# Patient Record
Sex: Female | Born: 2000 | Race: White | Hispanic: No | Marital: Single | Smoking: Former smoker
Health system: Southern US, Community
[De-identification: ages and names within clinical notes are randomized; demographics above are authoritative.]

## PROBLEM LIST (undated history)

## (undated) DIAGNOSIS — Z5189 Encounter for other specified aftercare: Secondary | ICD-10-CM

## (undated) DIAGNOSIS — K5900 Constipation, unspecified: Secondary | ICD-10-CM

## (undated) DIAGNOSIS — R109 Unspecified abdominal pain: Secondary | ICD-10-CM

## (undated) DIAGNOSIS — F909 Attention-deficit hyperactivity disorder, unspecified type: Secondary | ICD-10-CM

## (undated) DIAGNOSIS — M41124 Adolescent idiopathic scoliosis, thoracic region: Secondary | ICD-10-CM

## (undated) HISTORY — DX: Unspecified abdominal pain: R10.9

## (undated) HISTORY — PX: WISDOM TOOTH EXTRACTION: SHX21

## (undated) HISTORY — DX: Adolescent idiopathic scoliosis, thoracic region: M41.124

## (undated) HISTORY — DX: Encounter for other specified aftercare: Z51.89

## (undated) HISTORY — DX: Constipation, unspecified: K59.00

## (undated) HISTORY — DX: Attention-deficit hyperactivity disorder, unspecified type: F90.9

## (undated) HISTORY — PX: DILATION AND CURETTAGE OF UTERUS: SHX78

---
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
## 2021-01-04 ENCOUNTER — Telehealth: Payer: Self-pay | Admitting: Adult Health

## 2021-01-04 NOTE — Telephone Encounter (Signed)
Left message to call me.

## 2021-01-26 ENCOUNTER — Other Ambulatory Visit: Payer: Self-pay

## 2021-01-26 ENCOUNTER — Other Ambulatory Visit (INDEPENDENT_AMBULATORY_CARE_PROVIDER_SITE_OTHER): Payer: BLUE CROSS/BLUE SHIELD

## 2021-01-26 ENCOUNTER — Other Ambulatory Visit (HOSPITAL_COMMUNITY)
Admission: RE | Admit: 2021-01-26 | Discharge: 2021-01-26 | Disposition: A | Discharge status: Home / Self Care | Payer: BLUE CROSS/BLUE SHIELD | Source: Ambulatory Visit | Attending: Obstetrics & Gynecology | Admitting: Obstetrics & Gynecology

## 2021-01-26 DIAGNOSIS — Z113 Encounter for screening for infections with a predominantly sexual mode of transmission: Secondary | ICD-10-CM

## 2021-01-26 DIAGNOSIS — A749 Chlamydial infection, unspecified: Secondary | ICD-10-CM

## 2021-01-26 DIAGNOSIS — Z1389 Encounter for screening for other disorder: Secondary | ICD-10-CM | POA: Diagnosis not present

## 2021-01-26 LAB — POCT URINALYSIS DIPSTICK OB
Blood, UA: NEGATIVE
Glucose, UA: NEGATIVE
Ketones, UA: NEGATIVE
Leukocytes, UA: NEGATIVE
Nitrite, UA: NEGATIVE
POC,PROTEIN,UA: NEGATIVE

## 2021-01-26 NOTE — Progress Notes (Addendum)
   NURSE VISIT- VAGINITIS/STD/POC  SUBJECTIVE:  Carol Buck is a 20 y.o. G0P0000 GYN patientfemale here for a vaginal swab for proof of cure after treatment for Chlamydia.  She reports the following symptoms: discharge described as white for 4 weeks. Denies abnormal vaginal bleeding, significant pelvic pain, fever, or UTI symptoms.  OBJECTIVE:  There were no vitals taken for this visit.  Appears well, in no apparent distress  ASSESSMENT: Vaginal swab for proof of cure after treatment for chlamydia  PLAN: Self-collected vaginal probe for Chlamydia sent to lab Treatment: to be determined once results are received Follow-up as needed if symptoms persist/worsen, or new symptoms develop    NURSE VISIT- UTI SYMPTOMS   SUBJECTIVE:  Carol Buck is a 20 y.o. G0P0000 female here for UTI symptoms. She is a GYN patient. She reports N/A.  OBJECTIVE:  There were no vitals taken for this visit.  Appears well, in no apparent distress  Results for orders placed or performed in visit on 01/26/21 (from the past 24 hour(s))  POC Urinalysis Dipstick OB   Collection Time: 01/26/21 11:14 AM  Result Value Ref Range   Color, UA     Clarity, UA     Glucose, UA Negative Negative   Bilirubin, UA     Ketones, UA neg    Spec Grav, UA     Blood, UA neg    pH, UA     POC,PROTEIN,UA Negative Negative, Trace, Small (1+), Moderate (2+), Large (3+), 4+   Urobilinogen, UA     Nitrite, UA neg    Leukocytes, UA Negative Negative   Appearance     Odor      ASSESSMENT: GYN patient with UTI symptoms and negative nitrites  PLAN: Note routed to Cathie Beams, CNM   Rx sent by provider today: No Urine culture not sent Call or return to clinic prn if these symptoms worsen or fail to improve as anticipated. Follow-up: as needed   Evynn Boutelle A Florita Nitsch  01/26/2021 11:15 AM   Chart reviewed for nurse visit. Agree with plan of care.  Jacklyn Shell, [place] 01/26/2021 1:49 PM

## 2021-01-27 LAB — HIV ANTIBODY (ROUTINE TESTING W REFLEX): HIV Screen 4th Generation wRfx: NONREACTIVE

## 2021-01-27 LAB — HSV 2 ANTIBODY, IGG: HSV 2 IgG, Type Spec: <0.91 index (ref 0.00–0.90)

## 2021-01-27 LAB — CERVICOVAGINAL ANCILLARY ONLY
Chlamydia: NEGATIVE
Comment: NEGATIVE
Comment: NORMAL
Neisseria Gonorrhea: NEGATIVE

## 2021-01-27 LAB — RPR: RPR Ser Ql: NONREACTIVE

## 2021-01-28 LAB — URINE CULTURE

## 2023-06-04 LAB — OB RESULTS CONSOLE HIV ANTIBODY (ROUTINE TESTING): HIV: NONREACTIVE

## 2023-06-04 LAB — OB RESULTS CONSOLE GC/CHLAMYDIA
Chlamydia: NEGATIVE
Neisseria Gonorrhea: NEGATIVE

## 2023-06-04 LAB — OB RESULTS CONSOLE HEPATITIS B SURFACE ANTIGEN: Hepatitis B Surface Ag: NEGATIVE

## 2023-06-04 LAB — OB RESULTS CONSOLE ABO/RH: RH Type: POSITIVE

## 2023-06-04 LAB — OB RESULTS CONSOLE ANTIBODY SCREEN: Antibody Screen: NEGATIVE

## 2023-06-04 LAB — HEPATITIS C ANTIBODY: HCV Ab: NEGATIVE

## 2023-06-04 LAB — OB RESULTS CONSOLE RPR: RPR: NONREACTIVE

## 2023-06-04 LAB — OB RESULTS CONSOLE RUBELLA ANTIBODY, IGM: Rubella: IMMUNE

## 2023-07-11 LAB — PANORAMA PRENATAL TEST FULL PANEL:PANORAMA TEST PLUS 5 ADDITIONAL MICRODELETIONS: FETAL FRACTION: 10.3

## 2023-12-02 ENCOUNTER — Telehealth: Payer: Self-pay

## 2023-12-02 NOTE — Telephone Encounter (Signed)
Tish can you look over this patients records she would like to transfer here from [facility] her records are in care everywhere she is 36 weeks today.

## 2023-12-03 NOTE — Telephone Encounter (Signed)
Returned patient's call. States she is [redacted] weeks pregnant and is wanting to transfer care to our office.  She is scheduled for an ultrasound next week to check on the presentation of the baby as it was breech at her last ultrasound.  Informed patient, she would not need an additional ultrasound here based on that but we could get her in next week.  Pt verbalized understanding and appt made.

## 2023-12-11 ENCOUNTER — Encounter: Payer: Self-pay | Admitting: *Deleted

## 2023-12-12 ENCOUNTER — Other Ambulatory Visit (HOSPITAL_COMMUNITY)
Admission: RE | Admit: 2023-12-12 | Discharge: 2023-12-12 | Disposition: A | Discharge status: Home / Self Care | Payer: Medicaid Other | Source: Ambulatory Visit | Attending: Women's Health | Admitting: Women's Health

## 2023-12-12 ENCOUNTER — Ambulatory Visit: Payer: Medicaid Other | Admitting: Women's Health

## 2023-12-12 ENCOUNTER — Encounter: Payer: Self-pay | Admitting: Women's Health

## 2023-12-12 ENCOUNTER — Encounter: Payer: Medicaid Other | Admitting: *Deleted

## 2023-12-12 VITALS — BP 115/62 | HR 81 | Wt 151.0 lb

## 2023-12-12 DIAGNOSIS — Z124 Encounter for screening for malignant neoplasm of cervix: Secondary | ICD-10-CM

## 2023-12-12 DIAGNOSIS — Z3483 Encounter for supervision of other normal pregnancy, third trimester: Secondary | ICD-10-CM

## 2023-12-12 DIAGNOSIS — Z3A36 36 weeks gestation of pregnancy: Secondary | ICD-10-CM | POA: Diagnosis not present

## 2023-12-12 DIAGNOSIS — O321XX0 Maternal care for breech presentation, not applicable or unspecified: Secondary | ICD-10-CM | POA: Insufficient documentation

## 2023-12-12 DIAGNOSIS — Z3480 Encounter for supervision of other normal pregnancy, unspecified trimester: Secondary | ICD-10-CM

## 2023-12-12 DIAGNOSIS — Z3490 Encounter for supervision of normal pregnancy, unspecified, unspecified trimester: Secondary | ICD-10-CM | POA: Insufficient documentation

## 2023-12-12 MED ORDER — BLOOD PRESSURE MONITOR MISC: 0 refills | Status: DC

## 2023-12-12 NOTE — Progress Notes (Signed)
 INITIAL OBSTETRICAL VISIT Patient name: Carol Buck MRN [id number]  Date of birth: Jan 26, 2001 Chief Complaint:   Initial Prenatal Visit (Tx care from [facility])  History of Present Illness:   Carol Buck is a 23 y.o. G43P0010 Caucasian female at [redacted]w[redacted]d by LMP c/w u/s at 8 weeks with an Estimated Date of Delivery: 01/06/24 being seen today for her initial obstetrical visit with us , transferring from [facility].   Patient's last menstrual period was 04/01/2023. Her obstetrical history is significant for  SAB x 1 .   Today she reports  irregular contractions .  Last pap never. Results were: N/A     12/12/2023    3:40 PM 01/02/2019   10:42 AM  Depression screen PHQ 2/9  Decreased Interest 0 0  Down, Depressed, Hopeless 0 0  PHQ - 2 Score 0 0  Altered sleeping 1   Tired, decreased energy 1   Change in appetite 0   Feeling bad or failure about yourself  0   Trouble concentrating 0   Moving slowly or fidgety/restless 0   Suicidal thoughts 0   PHQ-9 Score 2         12/12/2023    3:41 PM  GAD 7 : Generalized Anxiety Score  Nervous, Anxious, on Edge 0  Control/stop worrying 0  Worry too much - different things 0  Trouble relaxing 0  Restless 0  Easily annoyed or irritable 0  Afraid - awful might happen 0  Total GAD 7 Score 0     Review of Systems:   Pertinent items are noted in HPI Denies cramping/contractions, leakage of fluid, vaginal bleeding, abnormal vaginal discharge w/ itching/odor/irritation, headaches, visual changes, shortness of breath, chest pain, abdominal pain, severe nausea/vomiting, or problems with urination or bowel movements unless otherwise stated above.  Pertinent History Reviewed:  Reviewed past medical,surgical, social, obstetrical and family history.  Reviewed problem list, medications and allergies. OB History  Gravida Para Term Preterm AB Living  2 0 0 0 1 0  SAB IAB Ectopic Multiple Live Births  1 0 0 0 0    # Outcome Date GA Lbr Len/2nd Weight  Sex Type Anes PTL Lv  2 Current           1 SAB 11/08/22 [redacted]w[redacted]d          Physical Assessment:   Vitals:   12/12/23 1537  BP: 115/62  Pulse: 81  Weight: 151 lb (68.5 kg)  Body mass index is 23.65 kg/m.       Physical Examination:  General appearance - well appearing, and in no distress  Mental status - alert, oriented to person, place, and time  Psych:  She has a normal mood and affect  Skin - warm and dry, normal color, no suspicious lesions noted  Chest - effort normal, all lung fields clear to auscultation bilaterally  Heart - normal rate and regular rhythm  Abdomen - soft, nontender  Extremities:  No swelling or varicosities noted  Pelvic - VULVA: normal appearing vulva with no masses, tenderness or lesions  VAGINA: normal appearing vagina with normal color and discharge, no lesions  CERVIX: normal appearing cervix without discharge or lesions, no CMT  Thin prep pap is done w/ reflex HR HPV cotesting  Chaperone: Peggy Dones    TODAY'S FH: 33cm FHR via doppler: 145  Informal TA u/s: breech, head maternal RUQ  No results found for this or any previous visit (from the past 24 hours).  Assessment & Plan:  1) Low-Risk Pregnancy G2P0010 at [redacted]w[redacted]d with an Estimated Date of Delivery: 01/06/24   2) Initial OB visit w/ us  (tx from [place])  3) Breech> head maternal RUQ, reviewed ECV risks/benefits, is not interested. Gave tips to help encourage baby to turn. To f/u w/ MD next week to schedule PCS if baby doesn't turn  4) Size <dates- EFW 24% at 32.1  5) Cervical cancer screen> pap today  Meds:  Meds ordered this encounter  Medications   Blood Pressure Monitor MISC    Sig: For regular home bp monitoring during pregnancy    Dispense:  1 each    Refill:  0    Z34.81 Please mail to patient    Initial labs obtained Continue prenatal vitamins Reviewed ptl s/s, reasons to seek care Reviewed recommended weight gain based on pre-gravid BMI Encouraged well-balanced diet Genetic &  carrier screening: neg Panorama, Horizon and AFP Ultrasound discussed; fetal survey: results reviewed CCNC completed> form faxed if has or is planning to apply for medicaid The nature of  - Center for [employer] with multiple MDs and other Advanced Practice Providers was explained to patient; also emphasized that fellows, residents, and students are part of our team. Declined flu & tdap  Follow-up: Return for 1wk LROB w/ MD only (schedule c/s), then weekly LROB .   Orders Placed This Encounter  Procedures   Culture, beta strep (group b only)   OB RESULTS CONSOLE GC/Chlamydia   OB RESULTS CONSOLE RPR   OB RESULTS CONSOLE HIV antibody   OB RESULTS CONSOLE Rubella Antibody   OB RESULTS CONSOLE Hepatitis B surface antigen   Hepatitis C antibody   OB RESULTS CONSOLE ABO/Rh   OB RESULTS CONSOLE Antibody Screen    Suzen JONELLE Fetters CNM, [facility] 12/12/2023 4:38 PM

## 2023-12-12 NOTE — Patient Instructions (Addendum)
 Carol Buck, thank you for choosing our office today! We appreciate the opportunity to meet your healthcare needs. You may receive a short survey by mail, e-mail, or through [employer]. If you are happy with your care we would appreciate if you could take just a few minutes to complete the survey questions. We read all of your comments and take your feedback very seriously. Thank you again for choosing our office.  Center for [employer] Team at [facility]  [facility] ([address] [place], [place] [address]) Entrance C, located off of [address] [employer] [address] valet parking   So your baby is breech? Here are some things you can try to encourage baby to turn head down: [email or web address].[email or web address] (different positions to try and more information) [facility] [phone]) $120 new patients, do not file insurances [facility] [phone]) Webster's technique- has to be done by a chiropractor certified in the technique Dive into the deep end of a swimming pool, or do a hand-stand in a swimming pool (the water  should be deep enough that your belly is completely covered)   CLASSES: Go to [email or web address] to register for classes (childbirth, breastfeeding, waterbirth, infant CPR, daddy bootcamp, etc.)  Call the office [phone]) or go to [facility] if: You begin to have strong, frequent contractions Your water  breaks.  Sometimes it is a big gush of fluid, sometimes it is just a trickle that keeps getting your panties wet or running down your legs You have vaginal bleeding.  It is normal to have a small amount of spotting if your cervix was checked.  You don't feel your baby moving like normal.  If you don't, get you something to eat and drink and lay down and focus on feeling your baby move.   If your baby is still not moving like normal, you should call the office or go to [facility].  Call the office [phone]) or  go to [facility] hospital for these signs of pre-eclampsia: Severe headache that does not go away with Tylenol  Visual changes- seeing spots, double, blurred vision Pain under your right breast or upper abdomen that does not go away with Tums or heartburn medicine Nausea and/or vomiting Severe swelling in your hands, feet, and face   Tdap Vaccine It is recommended that you get the Tdap vaccine during the third trimester of EACH pregnancy to help protect your baby from getting pertussis (whooping cough) 27-36 weeks is the BEST time to do this so that you can pass the protection on to your baby. During pregnancy is better than after pregnancy, but if you are unable to get it during pregnancy it will be offered at the hospital.  You can get this vaccine with us , at the health department, your family doctor, or some local pharmacies Everyone who will be around your baby should also be up-to-date on their vaccines before the baby comes. Adults (who are not pregnant) only need 1 dose of Tdap during adulthood.   [facility] Pediatricians/Family Doctors [facility]): [address] [place]uba [place], [phone]           [facility]: [address], [phone]                [facility]): [address], [phone] (call to ask if accepting patients) [facility]: [address], [place], [phone]    [facility] Pediatricians/Family Doctors [facility]): [phone] [address], [phone] [facility]: [address] [place], [phone] [facility]: [address], [phone]  [facility] Doctors  [facility]): [phone] [facility]: [address], [phone]   [facility] Doctors [facility]: [address] [phone]  [facility] Doctors  [facility]: [phone], [phone]  Home Blood Pressure Monitoring for Patients   Your provider has recommended that you check your blood pressure (BP) at least once a week at home. If you do not have a blood pressure cuff at home, one will be provided for you. Contact your provider if you have not received your monitor within 1 week.   Helpful Tips for Accurate Home Blood Pressure Checks  Don't smoke, exercise, or drink caffeine 30 minutes before checking your BP Use the restroom before checking your BP (a full bladder can raise your pressure) Relax in a comfortable upright chair Feet on the ground Left arm resting comfortably on a flat surface at the level of your heart Legs uncrossed Back supported Sit quietly and don't talk Place the cuff on your bare arm Adjust snuggly, so that only two fingertips can fit between your skin and the top of the cuff Check 2 readings separated by at least one minute Keep a log of your BP readings For a visual, please reference this diagram: http://ccnc.care/bpdiagram  Provider Name: [facility]     Phone: [phone]  Zone 1: ALL CLEAR  Continue to monitor your symptoms:  BP reading is less than 140 (top number) or less than 90 (bottom number)  No right upper stomach pain No headaches or seeing spots No feeling nauseated or throwing up No swelling in face and hands  Zone 2: CAUTION Call your doctor's office for any of the following:  BP reading is greater than 140 (top number) or greater than 90 (bottom number)  Stomach pain under your ribs in the middle or right side Headaches or seeing spots Feeling nauseated or throwing up Swelling in face and hands  Zone 3: EMERGENCY  Seek immediate medical care if you have any of the following:  BP reading is greater than160 (top number) or greater than 110 (bottom number) Severe headaches not improving with Tylenol  Serious difficulty catching your breath Any worsening symptoms from Zone 2   Preterm Labor and Birth Information  The normal length of a pregnancy is 39-41 weeks. Preterm labor is when labor starts before 37 completed weeks of pregnancy. What are the risk factors for preterm labor? Preterm labor is more likely to occur in women who: Have certain infections during pregnancy such as a bladder infection, sexually transmitted infection, or infection inside the uterus (chorioamnionitis). Have a shorter-than-normal cervix. Have gone into preterm labor before. Have had surgery on their cervix. Are younger than age 6 or older than age 54. Are African American. Are pregnant with twins or multiple babies (multiple gestation). Take street drugs or smoke while pregnant. Do not gain enough weight while pregnant. Became pregnant shortly after having been pregnant. What are the symptoms of preterm labor? Symptoms of preterm labor include: Cramps similar to those that can happen during a menstrual period. The cramps may happen with diarrhea. Pain in the abdomen or lower back. Regular uterine contractions that may feel like tightening of the abdomen. A feeling of increased pressure in the pelvis. Increased watery or bloody mucus discharge from the vagina. Water  breaking (  ruptured amniotic sac). Why is it important to recognize signs of preterm labor? It is important to recognize signs of preterm labor because babies who are born prematurely may not be fully developed. This can put them at an increased risk for: Long-term (chronic) heart and lung problems. Difficulty immediately after birth with regulating body systems, including blood sugar, body temperature, heart rate, and breathing rate. Bleeding in the brain. Cerebral palsy. Learning difficulties. Death. These risks are highest for babies who are born before 34 weeks of pregnancy. How is preterm labor treated? Treatment depends on the length of your pregnancy, your condition, and the health of your baby. It may  involve: Having a stitch (suture) placed in your cervix to prevent your cervix from opening too early (cerclage). Taking or being given medicines, such as: Hormone medicines. These may be given early in pregnancy to help support the pregnancy. Medicine to stop contractions. Medicines to help mature the baby's lungs. These may be prescribed if the risk of delivery is high. Medicines to prevent your baby from developing cerebral palsy. If the labor happens before 34 weeks of pregnancy, you may need to stay in the hospital. What should I do if I think I am in preterm labor? If you think that you are going into preterm labor, call your health care provider right away. How can I prevent preterm labor in future pregnancies? To increase your chance of having a full-term pregnancy: Do not use any tobacco products, such as cigarettes, chewing tobacco, and e-cigarettes. If you need help quitting, ask your health care provider. Do not use street drugs or medicines that have not been prescribed to you during your pregnancy. Talk with your health care provider before taking any herbal supplements, even if you have been taking them regularly. Make sure you gain a healthy amount of weight during your pregnancy. Watch for infection. If you think that you might have an infection, get it checked right away. Make sure to tell your health care provider if you have gone into preterm labor before. This information is not intended to replace advice given to you by your health care provider. Make sure you discuss any questions you have with your health care provider. Document Revised: 02/13/2019 Document Reviewed: 03/14/2016 Elsevier Patient Education  2020 [employer].

## 2023-12-16 LAB — CULTURE, BETA STREP (GROUP B ONLY): Strep Gp B Culture: NEGATIVE

## 2023-12-18 ENCOUNTER — Encounter: Payer: Self-pay | Admitting: Women's Health

## 2023-12-18 LAB — CYTOLOGY - PAP
Chlamydia: NEGATIVE
Comment: NEGATIVE
Comment: NORMAL
Diagnosis: NEGATIVE
Neisseria Gonorrhea: NEGATIVE

## 2023-12-19 ENCOUNTER — Other Ambulatory Visit: Payer: Self-pay | Admitting: Obstetrics and Gynecology

## 2023-12-19 ENCOUNTER — Encounter: Payer: Self-pay | Admitting: Obstetrics & Gynecology

## 2023-12-19 ENCOUNTER — Ambulatory Visit: Payer: Medicaid Other | Admitting: Obstetrics & Gynecology

## 2023-12-19 VITALS — BP 119/76 | HR 102 | Wt 151.2 lb

## 2023-12-19 DIAGNOSIS — Z3480 Encounter for supervision of other normal pregnancy, unspecified trimester: Secondary | ICD-10-CM

## 2023-12-19 DIAGNOSIS — O321XX0 Maternal care for breech presentation, not applicable or unspecified: Secondary | ICD-10-CM

## 2023-12-19 DIAGNOSIS — Z3483 Encounter for supervision of other normal pregnancy, third trimester: Secondary | ICD-10-CM

## 2023-12-19 DIAGNOSIS — Z3A37 37 weeks gestation of pregnancy: Secondary | ICD-10-CM | POA: Diagnosis not present

## 2023-12-19 NOTE — Progress Notes (Signed)
   LOW-RISK PREGNANCY VISIT Patient name: Carol Buck MRN [id number]  Date of birth: 2001/01/13 Chief Complaint:   Routine Prenatal Visit  History of Present Illness:   Carol Buck is a 23 y.o. G76P0010 female at [redacted]w[redacted]d with an Estimated Date of Delivery: 01/06/24 being seen today for ongoing management of a low-risk pregnancy.   -Breech presentation    12/12/2023    3:40 PM 01/02/2019   10:42 AM  Depression screen PHQ 2/9  Decreased Interest 0 0  Down, Depressed, Hopeless 0 0  PHQ - 2 Score 0 0  Altered sleeping 1   Tired, decreased energy 1   Change in appetite 0   Feeling bad or failure about yourself  0   Trouble concentrating 0   Moving slowly or fidgety/restless 0   Suicidal thoughts 0   PHQ-9 Score 2     Today she reports no complaints. Contractions: Not present. Vag. Bleeding: None.  Movement: Present. denies leaking of fluid. Review of Systems:   Pertinent items are noted in HPI Denies abnormal vaginal discharge w/ itching/odor/irritation, headaches, visual changes, shortness of breath, chest pain, abdominal pain, severe nausea/vomiting, or problems with urination or bowel movements unless otherwise stated above. Pertinent History Reviewed:  Reviewed past medical,surgical, social, obstetrical and family history.  Reviewed problem list, medications and allergies.  Physical Assessment:   Vitals:   12/19/23 1451  BP: 119/76  Pulse: (!) 102  Weight: 151 lb 3.2 oz (68.6 kg)  Body mass index is 23.68 kg/m.        Physical Examination:   General appearance: Well appearing, and in no distress  Mental status: Alert, oriented to person, place, and time  Skin: Warm & dry  Respiratory: Normal respiratory effort, no distress  Abdomen: Soft, gravid, nontender  Pelvic: Cervical exam deferred         Extremities:  no edema  Psych:  mood and affect appropriate  Fetal Status: Fetal Heart Rate (bpm): 130 Fundal Height: 36 cm Movement: Present    Chaperone: n/a    No  results found for this or any previous visit (from the past 24 hours).   Assessment & Plan:  1) Low-risk pregnancy G2P0010 at [redacted]w[redacted]d with an Estimated Date of Delivery: 01/06/24   2) Breech presentation -Declined ECV -Plan to schedule for 2/25 The risks of surgery were discussed with the patient including but were not limited to: bleeding which may require transfusion or reoperation; infection which may require antibiotics; injury to bowel, bladder, ureters or other surrounding organs; injury to the fetus; need for additional procedures including hysterectomy in the event of a life-threatening hemorrhage; formation of adhesions; placental abnormalities with subsequent pregnancies; incisional problems; thromboembolic phenomenon and other postoperative/anesthesia complications.   -desires to proceed, message sent for 2/25   Meds: No orders of the defined types were placed in this encounter.  Labs/procedures today: doppler  Plan:  Continue routine obstetrical care  Next visit: prefers in person    Reviewed: Term labor symptoms and general obstetric precautions including but not limited to vaginal bleeding, contractions, leaking of fluid and fetal movement were reviewed in detail with the patient.  All questions were answered.   Follow-up: Return in about 1 week (around 12/26/2023) for LROB visit.  Orders Placed This Encounter  Procedures   Ambulatory Referral For Surgery Scheduling    Myna Hidalgo, DO Attending Obstetrician & Gynecologist, Faculty Practice Center for [facility], [facility]

## 2023-12-23 NOTE — Patient Instructions (Signed)
Carol Buck  12/23/2023   Your procedure is scheduled on:  12/31/2023  Arrive at 1015 at Entrance C on [employer] at [facility]  and [employer]. You are invited to use the FREE valet parking or use the Visitor's parking deck.  Pick up the phone at the desk and dial [phone].  Call this number if you have problems the morning of surgery: [phone]  Remember:   Do not eat food:(After Midnight) Desps de medianoche.  You may drink clear liquids until arrival at ___1015__.  Clear liquids means a liquid you can see thru.  It can have color such as Cola or Kool aid.  Tea is OK and coffee as long as no milk or creamer of any kind.  Take these medicines the morning of surgery with A SIP OF WATER:  none   Do not wear jewelry, make-up or nail polish.  Do not wear lotions, powders, or perfumes. Do not wear deodorant.  Do not shave 48 hours prior to surgery.  Do not bring valuables to the hospital.  [facility] is not   responsible for any belongings or valuables brought to the hospital.  Contacts, dentures or bridgework may not be worn into surgery.  Leave suitcase in the car. After surgery it may be brought to your room.  For patients admitted to the hospital, checkout time is 11:00 AM the day of              discharge.      Please read over the following fact sheets that you were given:     Preparing for Surgery

## 2023-12-24 ENCOUNTER — Telehealth (HOSPITAL_COMMUNITY): Payer: Self-pay | Admitting: *Deleted

## 2023-12-24 ENCOUNTER — Encounter (HOSPITAL_COMMUNITY): Payer: Self-pay

## 2023-12-24 NOTE — Telephone Encounter (Signed)
 Preadmission screen

## 2023-12-25 ENCOUNTER — Encounter (HOSPITAL_COMMUNITY): Payer: Self-pay

## 2023-12-26 ENCOUNTER — Encounter: Payer: Medicaid Other | Admitting: Advanced Practice Midwife

## 2023-12-30 ENCOUNTER — Encounter (HOSPITAL_COMMUNITY): Payer: Self-pay | Admitting: Obstetrics and Gynecology

## 2023-12-30 ENCOUNTER — Encounter (HOSPITAL_COMMUNITY)
Admission: RE | Admit: 2023-12-30 | Discharge: 2023-12-30 | Disposition: A | Discharge status: Home / Self Care | Payer: Medicaid Other | Source: Ambulatory Visit | Attending: Family Medicine | Admitting: Family Medicine

## 2023-12-30 DIAGNOSIS — O321XX0 Maternal care for breech presentation, not applicable or unspecified: Secondary | ICD-10-CM | POA: Diagnosis not present

## 2023-12-30 DIAGNOSIS — Z3480 Encounter for supervision of other normal pregnancy, unspecified trimester: Secondary | ICD-10-CM

## 2023-12-30 DIAGNOSIS — Z3A00 Weeks of gestation of pregnancy not specified: Secondary | ICD-10-CM | POA: Diagnosis not present

## 2023-12-30 DIAGNOSIS — Z01812 Encounter for preprocedural laboratory examination: Secondary | ICD-10-CM | POA: Insufficient documentation

## 2023-12-30 LAB — TYPE AND SCREEN
ABO/RH(D): A POS
Antibody Screen: NEGATIVE

## 2023-12-30 LAB — CBC
HCT: 31 % — ABNORMAL LOW (ref 36.0–46.0)
Hemoglobin: 9.6 g/dL — ABNORMAL LOW (ref 12.0–15.0)
MCH: 24.7 pg — ABNORMAL LOW (ref 26.0–34.0)
MCHC: 31 g/dL (ref 30.0–36.0)
MCV: 79.7 fL — ABNORMAL LOW (ref 80.0–100.0)
Platelets: 340 10*3/uL (ref 150–400)
RBC: 3.89 MIL/uL (ref 3.87–5.11)
RDW: 14.5 % (ref 11.5–15.5)
WBC: 9.4 10*3/uL (ref 4.0–10.5)
nRBC: 0 % (ref 0.0–0.2)

## 2023-12-30 LAB — RPR: RPR Ser Ql: NONREACTIVE

## 2023-12-30 NOTE — Anesthesia Preprocedure Evaluation (Signed)
 Anesthesia Evaluation  Patient identified by MRN, date of birth, ID band Patient awake    Reviewed: Allergy & Precautions, H&P , NPO status , Patient's Chart, lab work & pertinent test results  Airway Mallampati: II  TM Distance: >3 FB Neck ROM: Full    Dental no notable dental hx.    Pulmonary neg pulmonary ROS, former smoker   Pulmonary exam normal breath sounds clear to auscultation       Cardiovascular Exercise Tolerance: Good negative cardio ROS Normal cardiovascular exam Rhythm:Regular Rate:Normal     Neuro/Psych negative neurological ROS  negative psych ROS   GI/Hepatic negative GI ROS, Neg liver ROS,,,  Endo/Other  negative endocrine ROS    Renal/GU negative Renal ROS  negative genitourinary   Musculoskeletal negative musculoskeletal ROS (+)    Abdominal   Peds negative pediatric ROS (+)  Hematology negative hematology ROS (+)   Anesthesia Other Findings   Reproductive/Obstetrics negative OB ROS                             Anesthesia Physical Anesthesia Plan  ASA: 2  Anesthesia Plan: Spinal   Post-op Pain Management: Minimal or no pain anticipated   Induction: Intravenous  PONV Risk Score and Plan: 2 and Treatment may vary due to age or medical condition and Ondansetron  Airway Management Planned: Natural Airway and Simple Face Mask  Additional Equipment: None  Intra-op Plan:   Post-operative Plan:   Informed Consent: I have reviewed the patients History and Physical, chart, labs and discussed the procedure including the risks, benefits and alternatives for the proposed anesthesia with the patient or authorized representative who has indicated his/her understanding and acceptance.     Dental Advisory Given  Plan Discussed with: Anesthesiologist  Anesthesia Plan Comments: (  )        Anesthesia Quick Evaluation

## 2023-12-31 ENCOUNTER — Inpatient Hospital Stay (HOSPITAL_COMMUNITY): Payer: Self-pay | Admitting: Anesthesiology

## 2023-12-31 ENCOUNTER — Other Ambulatory Visit: Payer: Self-pay

## 2023-12-31 ENCOUNTER — Inpatient Hospital Stay (HOSPITAL_COMMUNITY)
Admission: AD | Admit: 2023-12-31 | Discharge: 2024-01-03 | DRG: 787 | Disposition: A | Discharge status: Home / Self Care | Payer: Medicaid Other | Attending: Obstetrics and Gynecology | Admitting: Obstetrics and Gynecology

## 2023-12-31 ENCOUNTER — Encounter (HOSPITAL_COMMUNITY)
Admission: AD | Disposition: A | Discharge status: Home / Self Care | Payer: Self-pay | Source: Home / Self Care | Attending: Obstetrics and Gynecology

## 2023-12-31 ENCOUNTER — Encounter (HOSPITAL_COMMUNITY): Payer: Self-pay | Admitting: Obstetrics and Gynecology

## 2023-12-31 DIAGNOSIS — O321XX0 Maternal care for breech presentation, not applicable or unspecified: Secondary | ICD-10-CM | POA: Diagnosis present

## 2023-12-31 DIAGNOSIS — Z3490 Encounter for supervision of normal pregnancy, unspecified, unspecified trimester: Principal | ICD-10-CM

## 2023-12-31 DIAGNOSIS — D62 Acute posthemorrhagic anemia: Secondary | ICD-10-CM | POA: Diagnosis not present

## 2023-12-31 DIAGNOSIS — Z3A39 39 weeks gestation of pregnancy: Secondary | ICD-10-CM

## 2023-12-31 DIAGNOSIS — Z87891 Personal history of nicotine dependence: Secondary | ICD-10-CM | POA: Diagnosis not present

## 2023-12-31 DIAGNOSIS — Z833 Family history of diabetes mellitus: Secondary | ICD-10-CM

## 2023-12-31 DIAGNOSIS — O99019 Anemia complicating pregnancy, unspecified trimester: Secondary | ICD-10-CM | POA: Diagnosis present

## 2023-12-31 DIAGNOSIS — O9081 Anemia of the puerperium: Secondary | ICD-10-CM | POA: Diagnosis not present

## 2023-12-31 LAB — ABO/RH: ABO/RH(D): A POS

## 2023-12-31 SURGERY — Surgical Case
Anesthesia: Spinal | Site: Abdomen

## 2023-12-31 MED ORDER — SCOPOLAMINE 1 MG/3DAYS TD PT72
MEDICATED_PATCH | TRANSDERMAL | Status: AC
  Filled 2023-12-31: qty 1

## 2023-12-31 MED ORDER — FENTANYL CITRATE (PF) 100 MCG/2ML IJ SOLN: 25.0000 ug | INTRAMUSCULAR | Status: DC | PRN

## 2023-12-31 MED ORDER — ONDANSETRON HCL 4 MG/2ML IJ SOLN
INTRAMUSCULAR | Status: AC
  Filled 2023-12-31: qty 2

## 2023-12-31 MED ORDER — OXYTOCIN-SODIUM CHLORIDE 30-0.9 UT/500ML-% IV SOLN
2.5000 [IU]/h | INTRAVENOUS | Status: AC
  Administered 2023-12-31: 2.5 [IU]/h via INTRAVENOUS
  Filled 2023-12-31: qty 500

## 2023-12-31 MED ORDER — OXYTOCIN-SODIUM CHLORIDE 30-0.9 UT/500ML-% IV SOLN
INTRAVENOUS | Status: DC | PRN
  Administered 2023-12-31: 300 mL via INTRAVENOUS

## 2023-12-31 MED ORDER — PHENYLEPHRINE HCL-NACL 20-0.9 MG/250ML-% IV SOLN
INTRAVENOUS | Status: DC | PRN
  Administered 2023-12-31: 60 ug/min via INTRAVENOUS

## 2023-12-31 MED ORDER — TRANEXAMIC ACID-NACL 1000-0.7 MG/100ML-% IV SOLN
INTRAVENOUS | Status: AC
  Filled 2023-12-31: qty 100

## 2023-12-31 MED ORDER — KETOROLAC TROMETHAMINE 30 MG/ML IJ SOLN
30.0000 mg | Freq: Once | INTRAMUSCULAR | Status: AC
  Administered 2023-12-31: 30 mg via INTRAVENOUS
  Filled 2023-12-31: qty 1

## 2023-12-31 MED ORDER — POVIDONE-IODINE 10 % EX SWAB
2.0000 | Freq: Once | CUTANEOUS | Status: AC
  Administered 2023-12-31: 2 via TOPICAL

## 2023-12-31 MED ORDER — LACTATED RINGERS IV SOLN: INTRAVENOUS | Status: DC

## 2023-12-31 MED ORDER — SIMETHICONE 80 MG PO CHEW: 80.0000 mg | CHEWABLE_TABLET | ORAL | Status: DC | PRN

## 2023-12-31 MED ORDER — SENNOSIDES-DOCUSATE SODIUM 8.6-50 MG PO TABS
2.0000 | ORAL_TABLET | ORAL | Status: DC
  Administered 2024-01-01: 2 via ORAL
  Filled 2023-12-31: qty 2

## 2023-12-31 MED ORDER — PHENYLEPHRINE HCL-NACL 20-0.9 MG/250ML-% IV SOLN
INTRAVENOUS | Status: AC
  Filled 2023-12-31: qty 250

## 2023-12-31 MED ORDER — IBUPROFEN 600 MG PO TABS: 600.0000 mg | ORAL_TABLET | Freq: Four times a day (QID) | ORAL | Status: DC

## 2023-12-31 MED ORDER — OXYTOCIN-SODIUM CHLORIDE 30-0.9 UT/500ML-% IV SOLN
INTRAVENOUS | Status: AC
  Filled 2023-12-31: qty 500

## 2023-12-31 MED ORDER — DIBUCAINE (PERIANAL) 1 % EX OINT: 1.0000 | TOPICAL_OINTMENT | CUTANEOUS | Status: DC | PRN

## 2023-12-31 MED ORDER — SIMETHICONE 80 MG PO CHEW
80.0000 mg | CHEWABLE_TABLET | Freq: Three times a day (TID) | ORAL | Status: DC
  Administered 2023-12-31 – 2024-01-01 (×2): 80 mg via ORAL
  Filled 2023-12-31 (×4): qty 1

## 2023-12-31 MED ORDER — FENTANYL CITRATE (PF) 100 MCG/2ML IJ SOLN
INTRAMUSCULAR | Status: AC
  Filled 2023-12-31: qty 2

## 2023-12-31 MED ORDER — FENTANYL CITRATE (PF) 100 MCG/2ML IJ SOLN
INTRAMUSCULAR | Status: DC | PRN
  Administered 2023-12-31: 15 ug via INTRATHECAL

## 2023-12-31 MED ORDER — ACETAMINOPHEN 160 MG/5ML PO SOLN: 325.0000 mg | ORAL | Status: DC | PRN

## 2023-12-31 MED ORDER — OXYCODONE HCL 5 MG PO TABS
5.0000 mg | ORAL_TABLET | ORAL | Status: DC | PRN
  Administered 2024-01-02 – 2024-01-03 (×4): 5 mg via ORAL
  Filled 2023-12-31 (×4): qty 1

## 2023-12-31 MED ORDER — OXYCODONE HCL 5 MG/5ML PO SOLN: 5.0000 mg | Freq: Once | ORAL | Status: DC | PRN

## 2023-12-31 MED ORDER — MORPHINE SULFATE (PF) 0.5 MG/ML IJ SOLN
INTRAMUSCULAR | Status: DC | PRN
  Administered 2023-12-31: 150 ug via INTRATHECAL

## 2023-12-31 MED ORDER — TRANEXAMIC ACID-NACL 1000-0.7 MG/100ML-% IV SOLN
INTRAVENOUS | Status: DC | PRN
  Administered 2023-12-31: 1000 mg via INTRAVENOUS

## 2023-12-31 MED ORDER — CEFAZOLIN SODIUM-DEXTROSE 2-4 GM/100ML-% IV SOLN
INTRAVENOUS | Status: AC
  Filled 2023-12-31: qty 100

## 2023-12-31 MED ORDER — MORPHINE SULFATE (PF) 0.5 MG/ML IJ SOLN
INTRAMUSCULAR | Status: AC
  Filled 2023-12-31: qty 10

## 2023-12-31 MED ORDER — MENTHOL 3 MG MT LOZG
1.0000 | LOZENGE | OROMUCOSAL | Status: DC | PRN
  Administered 2024-01-01: 3 mg via ORAL
  Filled 2023-12-31: qty 9

## 2023-12-31 MED ORDER — CEFAZOLIN SODIUM-DEXTROSE 2-4 GM/100ML-% IV SOLN
2.0000 g | INTRAVENOUS | Status: AC
  Administered 2023-12-31: 2 g via INTRAVENOUS

## 2023-12-31 MED ORDER — ZOLPIDEM TARTRATE 5 MG PO TABS: 5.0000 mg | ORAL_TABLET | Freq: Every evening | ORAL | Status: DC | PRN

## 2023-12-31 MED ORDER — WITCH HAZEL-GLYCERIN EX PADS: 1.0000 | MEDICATED_PAD | CUTANEOUS | Status: DC | PRN

## 2023-12-31 MED ORDER — STERILE WATER FOR IRRIGATION IR SOLN
Status: DC | PRN
  Administered 2023-12-31: 1000 mL

## 2023-12-31 MED ORDER — DEXAMETHASONE SODIUM PHOSPHATE 10 MG/ML IJ SOLN
INTRAMUSCULAR | Status: DC | PRN
  Administered 2023-12-31 (×2): 5 mg via INTRAVENOUS

## 2023-12-31 MED ORDER — ACETAMINOPHEN 10 MG/ML IV SOLN
INTRAVENOUS | Status: DC | PRN
  Administered 2023-12-31: 1000 mg via INTRAVENOUS

## 2023-12-31 MED ORDER — ARTIFICIAL TEARS OPHTHALMIC OINT
TOPICAL_OINTMENT | OPHTHALMIC | Status: AC
  Filled 2023-12-31: qty 3.5

## 2023-12-31 MED ORDER — OXYCODONE HCL 5 MG PO TABS: 5.0000 mg | ORAL_TABLET | Freq: Once | ORAL | Status: DC | PRN

## 2023-12-31 MED ORDER — MEPERIDINE HCL 25 MG/ML IJ SOLN: 6.2500 mg | INTRAMUSCULAR | Status: DC | PRN

## 2023-12-31 MED ORDER — ONDANSETRON HCL 4 MG/2ML IJ SOLN
INTRAMUSCULAR | Status: DC | PRN
  Administered 2023-12-31: 4 mg via INTRAVENOUS

## 2023-12-31 MED ORDER — IBUPROFEN 600 MG PO TABS
600.0000 mg | ORAL_TABLET | Freq: Four times a day (QID) | ORAL | Status: DC
  Administered 2023-12-31 – 2024-01-03 (×11): 600 mg via ORAL
  Filled 2023-12-31 (×11): qty 1

## 2023-12-31 MED ORDER — DEXAMETHASONE SODIUM PHOSPHATE 10 MG/ML IJ SOLN
INTRAMUSCULAR | Status: AC
Start: 2023-12-31 — End: ?
  Filled 2023-12-31: qty 1

## 2023-12-31 MED ORDER — COCONUT OIL OIL
1.0000 | TOPICAL_OIL | Status: DC | PRN
  Administered 2024-01-01: 1 via TOPICAL

## 2023-12-31 MED ORDER — BUPIVACAINE IN DEXTROSE 0.75-8.25 % IT SOLN
INTRATHECAL | Status: DC | PRN
Start: 2023-12-31 — End: 2023-12-31
  Administered 2023-12-31: 1.6 mL via INTRATHECAL

## 2023-12-31 MED ORDER — DIPHENHYDRAMINE HCL 25 MG PO CAPS
25.0000 mg | ORAL_CAPSULE | Freq: Four times a day (QID) | ORAL | Status: DC | PRN
  Administered 2024-01-01: 25 mg via ORAL
  Filled 2023-12-31: qty 1

## 2023-12-31 MED ORDER — ACETAMINOPHEN 10 MG/ML IV SOLN
INTRAVENOUS | Status: AC
  Filled 2023-12-31: qty 100

## 2023-12-31 MED ORDER — ACETAMINOPHEN 500 MG PO TABS
1000.0000 mg | ORAL_TABLET | Freq: Four times a day (QID) | ORAL | Status: DC
  Administered 2023-12-31 – 2024-01-03 (×11): 1000 mg via ORAL
  Filled 2023-12-31 (×11): qty 2

## 2023-12-31 MED ORDER — ACETAMINOPHEN 325 MG PO TABS: 325.0000 mg | ORAL_TABLET | ORAL | Status: DC | PRN

## 2023-12-31 MED ORDER — MEASLES, MUMPS & RUBELLA VAC IJ SOLR: 0.5000 mL | Freq: Once | INTRAMUSCULAR | Status: DC

## 2023-12-31 MED ORDER — ONDANSETRON HCL 4 MG/2ML IJ SOLN: 4.0000 mg | Freq: Once | INTRAMUSCULAR | Status: DC | PRN

## 2023-12-31 MED ORDER — SODIUM CHLORIDE 0.9 % IR SOLN
Status: DC | PRN
  Administered 2023-12-31: 1000 mL

## 2023-12-31 MED ORDER — SCOPOLAMINE 1 MG/3DAYS TD PT72
1.0000 | MEDICATED_PATCH | TRANSDERMAL | Status: DC
  Administered 2023-12-31: 1.5 mg via TRANSDERMAL

## 2023-12-31 MED ORDER — PRENATAL MULTIVITAMIN CH
1.0000 | ORAL_TABLET | Freq: Every day | ORAL | Status: DC
  Administered 2024-01-02 – 2024-01-03 (×2): 1 via ORAL
  Filled 2023-12-31 (×3): qty 1

## 2023-12-31 SURGICAL SUPPLY — 28 items
BENZOIN TINCTURE PRP APPL 2/3 (GAUZE/BANDAGES/DRESSINGS) ×1 IMPLANT
CHLORAPREP W/TINT 26 (MISCELLANEOUS) ×2 IMPLANT
CLAMP UMBILICAL CORD (MISCELLANEOUS) ×1 IMPLANT
CLOTH BEACON ORANGE TIMEOUT ST (SAFETY) ×1 IMPLANT
DERMABOND ADVANCED .7 DNX12 (GAUZE/BANDAGES/DRESSINGS) IMPLANT
DRSG OPSITE POSTOP 4X10 (GAUZE/BANDAGES/DRESSINGS) ×1 IMPLANT
ELECT REM PT RETURN 9FT ADLT (ELECTROSURGICAL) ×1 IMPLANT
ELECTRODE REM PT RTRN 9FT ADLT (ELECTROSURGICAL) ×1 IMPLANT
EXTRACTOR VACUUM M CUP 4 TUBE (SUCTIONS) IMPLANT
GAUZE SPONGE 4X4 12PLY STRL LF (GAUZE/BANDAGES/DRESSINGS) IMPLANT
GLOVE BIOGEL PI IND STRL 7.0 (GLOVE) ×2 IMPLANT
GLOVE BIOGEL PI IND STRL 7.5 (GLOVE) ×2 IMPLANT
GLOVE ECLIPSE 7.5 STRL STRAW (GLOVE) ×1 IMPLANT
GOWN STRL REUS W/TWL LRG LVL3 (GOWN DISPOSABLE) ×3 IMPLANT
KIT ABG SYR 3ML LUER SLIP (SYRINGE) IMPLANT
NDL HYPO 25X5/8 SAFETYGLIDE (NEEDLE) IMPLANT
NEEDLE HYPO 25X5/8 SAFETYGLIDE (NEEDLE) IMPLANT
NS IRRIG 1000ML POUR BTL (IV SOLUTION) ×1 IMPLANT
PACK C SECTION WH (CUSTOM PROCEDURE TRAY) ×1 IMPLANT
PAD OB MATERNITY 4.3X12.25 (PERSONAL CARE ITEMS) ×1 IMPLANT
RTRCTR C-SECT PINK 25CM LRG (MISCELLANEOUS) ×1 IMPLANT
STRIP CLOSURE SKIN 1/2X4 (GAUZE/BANDAGES/DRESSINGS) ×1 IMPLANT
SUT VIC AB 0 CT1 36 (SUTURE) ×3 IMPLANT
SUT VIC AB 2-0 CT1 TAPERPNT 27 (SUTURE) ×1 IMPLANT
SUT VIC AB 4-0 KS 27 (SUTURE) ×1 IMPLANT
TOWEL OR 17X24 6PK STRL BLUE (TOWEL DISPOSABLE) ×1 IMPLANT
TRAY FOLEY W/BAG SLVR 14FR LF (SET/KITS/TRAYS/PACK) ×1 IMPLANT
WATER STERILE IRR 1000ML POUR (IV SOLUTION) ×1 IMPLANT

## 2023-12-31 NOTE — Transfer of Care (Signed)
 Immediate Anesthesia Transfer of Care Note  Patient: Carol Buck  Procedure(s) Performed: CESAREAN SECTION (Abdomen)  Patient Location: PACU  Anesthesia Type:Spinal  Level of Consciousness: awake, alert , and oriented  Airway & Oxygen Therapy: Patient Spontanous Breathing  Post-op Assessment: Report given to RN and Post -op Vital signs reviewed and stable  Post vital signs: Reviewed and stable  Last Vitals:  Vitals Value Taken Time  BP 110/61 12/31/23 1419  Temp 36.4 C 12/31/23 1419  Pulse 70 12/31/23 1423  Resp 18 12/31/23 1423  SpO2 100 % 12/31/23 1423  Vitals shown include unfiled device data.  Last Pain:  Vitals:   12/31/23 1419  TempSrc: Oral         Complications: No notable events documented.

## 2023-12-31 NOTE — Op Note (Signed)
 Cesarean Section Operative Report  Carol Buck  12/31/2023  Indications: breech presentation (declined ECV)  Pre-operative Diagnosis: primary low transverse cesarean section  Post-operative Diagnosis: Same   Surgeon: Surgeons and Role:    * Lynette Topete, Wilfred Curtis, MD - Primary    * Joanne Gavel, MD - Assisting   Attending Attestation: I was present and scrubbed for the entire procedure.   An experienced assistant was required given the standard of surgical care given the complexity of the case.  This assistant was needed for exposure, dissection, suctioning, retraction, instrument exchange, assisting with delivery with administration of fundal pressure, and for overall help during the procedure.  Anesthesia: spinal    Quantified Blood Loss: 658 ml  Total IV Fluids: 2000 ml LR  Urine Output:: 200 ml clear yellow urine  Specimens: none  Findings: Viable female infant in frank breech presentation; Apgars pending; weight pending; arterial cord pH not obtained;  clear amniotic fluid; intact placenta with three vessel cord; normal uterus, fallopian tubes and ovaries bilaterally.  Baby condition / location:  Couplet care / Skin to Skin   Complications: no complications  Indications: Carol Buck is a 23 y.o. G2P0010 with an IUP [redacted]w[redacted]d presenting for scheduled primary cesarean section for breech presentation, declined attempt at ECV.  The risks, benefits, complications, treatment options, and exected outcomes were discussed with the patient . The patient dwith the proposed plan, giving informed consent. identified as Carol Buck and the procedure verified as C-Section Delivery.  Procedure Details:  The patient was taken back to the operative suite where spinal anesthesia was placed.  A time out was held and the above information confirmed.   After induction of anesthesia, the patient was draped and prepped in the usual sterile manner and placed in a dorsal supine position  with a leftward tilt. A Pfannenstiel incision was made and carried down through the subcutaneous tissue to the fascia. Fascial incision was made and bluntly extended transversely. The fascia was separated from the underlying rectus tissue superiorly and inferiorly. The peritoneum was identified and sharply entered and extended longitudinally. Alexis retractor was placed. A bladder flap was not created. A low transverse uterine incision was made and extended bluntly. Delivered from frank breech presentation was a viable infant with Apgars and weight as above.  After waiting 60 seconds for delayed cord cutting, the umbilical cord was clamped and cut cord blood was obtained for evaluation. Cord ph was not sent. The placenta was removed Intact and appeared normal.  The uterine incision was closed with running unlocked sutures 0-Vicryl in one layer.   Hemostasis was observed. The peritoneum was closed with 2-0 vicryl. The rectus muscles were examined and hemostasis observed. The fascia was then reapproximated with running sutures of 0-Vicryl. The subcuticular closure was performed with 2-0 plain gut. The skin was closed with 4-0 Vicryl.  Instrument, sponge, and needle counts were correct prior the abdominal closure and were correct at the conclusion of the case.     Disposition: PACU - hemodynamically stable.   Maternal Condition: stable       Signed: Silvano Bilis, MD 12/31/2023 2:01 PM

## 2023-12-31 NOTE — Anesthesia Procedure Notes (Signed)
 Spinal  Patient location during procedure: OR Start time: 12/31/2023 1:14 PM End time: 12/31/2023 1:20 PM Reason for block: surgical anesthesia Staffing Performed: other anesthesia staff  Anesthesiologist: Bethena Midget, MD Performed by: Bethena Midget, MD Authorized by: Bethena Midget, MD   Preanesthetic Checklist Completed: patient identified, IV checked, site marked, risks and benefits discussed, surgical consent, monitors and equipment checked, pre-op evaluation and timeout performed Spinal Block Patient position: sitting Prep: DuraPrep Patient monitoring: heart rate, cardiac monitor, continuous pulse ox and blood pressure Approach: midline Location: L3-4 Injection technique: single-shot Needle Needle type: Sprotte  Needle gauge: 24 G Needle length: 9 cm Assessment Sensory level: T4 Events: CSF return

## 2023-12-31 NOTE — H&P (Signed)
 LABOR AND DELIVERY ADMISSION HISTORY AND PHYSICAL NOTE  Carol Buck is a 23 y.o. female G2P0010 with IUP at [redacted]w[redacted]d by L/9 presenting for scheduled elective cesarean section.   She reports positive fetal movement. She denies leakage of fluid or vaginal bleeding.  Prenatal History/Complications:  Past Medical History: Past Medical History:  Diagnosis Date   Abdominal pain, recurrent    ADHD (attention deficit hyperactivity disorder)    Adolescent idiopathic scoliosis of thoracic region    Blood transfusion without reported diagnosis    Constipation     Past Surgical History: Past Surgical History:  Procedure Laterality Date   DILATION AND CURETTAGE OF UTERUS     intraoperative hemorrhage   WISDOM TOOTH EXTRACTION      Obstetrical History: OB History     Gravida  2   Para  0   Term  0   Preterm  0   AB  1   Living  0      SAB  1   IAB  0   Ectopic  0   Multiple  0   Live Births  0           Social History: Social History   Socioeconomic History   Marital status: Single    Spouse name: Not on file   Number of children: Not on file   Years of education: Not on file   Highest education level: Not on file  Occupational History   Not on file  Tobacco Use   Smoking status: Former    Types: E-cigarettes   Smokeless tobacco: Former    Types: Engineer, drilling   Vaping status: Former  Substance and Sexual Activity   Alcohol use: Not Currently   Drug use: Not Currently    Types: Marijuana   Sexual activity: Yes    Birth control/protection: None  Other Topics Concern   Not on file  Social History Narrative   Starting 4th grade   Social Drivers of Health   Financial Resource Strain: Not on file  Food Insecurity: No Food Insecurity (12/31/2023)   Hunger Vital Sign    Worried About Running Out of Food in the Last Year: Never true    Ran Out of Food in the Last Year: Never true  Transportation Needs: No Transportation Needs (12/31/2023)    PRAPARE - Administrator, Civil Service (Medical): No    Lack of Transportation (Non-Medical): No  Physical Activity: Insufficiently Active (12/12/2023)   Exercise Vital Sign    Days of Exercise per Week: 7 days    Minutes of Exercise per Session: 20 min  Stress: No Stress Concern Present (12/12/2023)   [employer] of Occupational Health - Occupational Stress Questionnaire    Feeling of Stress : Not at all  Social Connections: Moderately Integrated (12/12/2023)   Social Connection and Isolation Panel [NHANES]    Frequency of Communication with Friends and Family: More than three times a week    Frequency of Social Gatherings with Friends and Family: Once a week    Attends Religious Services: 1 to 4 times per year    Active Member of [employer] or Organizations: No    Attends Banker Meetings: Never    Marital Status: Living with partner    Family History: Family History  Problem Relation Age of Onset   Irritable bowel syndrome Mother    ADD / ADHD Brother    Cholelithiasis Maternal Aunt    High Cholesterol  Maternal Grandmother    Depression Paternal Grandmother    Anxiety disorder Paternal Grandmother    Cancer Paternal Grandmother        nasal   Diabetes Paternal Grandfather    Mental illness Paternal Grandfather     Allergies: No Known Allergies  Medications Prior to Admission  Medication Sig Dispense Refill Last Dose/Taking   Blood Pressure Monitor MISC For regular home bp monitoring during pregnancy 1 each 0      Review of Systems   All systems reviewed and negative except as stated in HPI  Blood pressure 127/82, pulse 88, temperature 98.1 F (36.7 C), temperature source Oral, resp. rate 16, height 5\' 7"  (1.702 m), weight 68.8 kg, last menstrual period 04/01/2023. General appearance: alert, cooperative, and appears stated age Lungs: clear to auscultation bilaterally Heart: regular rate and rhythm Abdomen: soft, non-tender; bowel sounds  normal Extremities: No calf swelling or tenderness Presentation: breech Fetal monitoring: 130s Uterine activity: quiet     Prenatal labs: ABO, Rh: --/--/A POS Performed at [facility], [address] [place], [place] [address]  [id number]) Antibody: NEG (02/24 1029) Rubella: Immune (07/30 0000) RPR: NON REACTIVE (02/24 1100)  HBsAg: Negative (07/30 0000)  HIV: Non-reactive (07/30 0000)  GBS: Negative/-- (02/06 0441)  1 hr Glucola: wnl Genetic screening:  wnl Anatomy US: wnl  Prenatal Transfer Tool  Maternal Diabetes: No Genetic Screening: Normal Maternal Ultrasounds/Referrals: Normal Fetal Ultrasounds or other Referrals:  None Maternal Substance Abuse:  No Significant Maternal Medications:  None Significant Maternal Lab Results: Group B Strep negative  Results for orders placed or performed during the hospital encounter of 12/31/23 (from the past 24 hours)  ABO/Rh   Collection Time: 12/31/23 10:35 AM  Result Value Ref Range   ABO/RH(D)      A POS Performed at [facility], [address] [place], [place] [address]     Patient Active Problem List   Diagnosis Date Noted   Anemia affecting pregnancy 12/31/2023   Encounter for supervision of normal pregnancy, antepartum 12/12/2023   Breech presentation 12/12/2023   Attention deficit hyperactivity disorder (ADHD), predominantly inattentive type 09/12/2015   Chronic constipation 06/26/2011    Assessment: Carol Buck is a 23 y.o. G2P0010 at [redacted]w[redacted]d here for scheduled primary cesarean for breech presentation. Breech presentation confirmed via bedside ultrasound. I patiently and at length reviewed risks and benefits of ECV and shared with the patient the risks of cesarean delivery compared with risks of attempting ECV. We also reviewed surgical risks with subsequent pregnancies if we perform a cesarean section today. I strongly advised attempting ECV.   The risks of cesarean section were discussed with the  patient including but were not limited to: bleeding which may require transfusion or reoperation; infection which may require antibiotics; injury to bowel, bladder, ureters or other surrounding organs; injury to the fetus; need for additional procedures including hysterectomy in the event of a life-threatening hemorrhage; placental abnormalities wth subsequent pregnancies, incisional problems, thromboembolic phenomenon and other postoperative/anesthesia complications.  She declines tubal sterilization. The patient concurred with the proposed plan, giving informed written consent for the procedures.  Patient has been NPO since midnight she will remain NPO for procedure. Anesthesia and OR aware.  Preoperative prophylactic antibiotics and SCDs ordered on call to the OR.  To OR when ready.  # Anemia: starting hgb of 9.6 noted  # feeding: breast  # contraception: undecided (counseled regarding birth spacing)  Silvano Bilis 12/31/2023, 12:45 PM

## 2023-12-31 NOTE — Anesthesia Postprocedure Evaluation (Signed)
 Anesthesia Post Note  Patient: Carol Buck  Procedure(s) Performed: CESAREAN SECTION (Abdomen)     Patient location during evaluation: PACU Anesthesia Type: Spinal Level of consciousness: oriented and awake and alert Pain management: pain level controlled Vital Signs Assessment: post-procedure vital signs reviewed and stable Respiratory status: spontaneous breathing, respiratory function stable and patient connected to nasal cannula oxygen Cardiovascular status: blood pressure returned to baseline and stable Postop Assessment: no headache, no backache and no apparent nausea or vomiting Anesthetic complications: no   No notable events documented.  Last Vitals:  Vitals:   12/31/23 1432 12/31/23 1435  BP:    Pulse: 70   Resp: 16   Temp:    SpO2: 100% 100%    Last Pain:  Vitals:   12/31/23 1435  TempSrc:   PainSc: 0-No pain   Pain Goal:    LLE Motor Response: Purposeful movement (12/31/23 1435) LLE Sensation: Tingling, Numbness (12/31/23 1435) RLE Motor Response: Purposeful movement (12/31/23 1435) RLE Sensation: Tingling, Numbness (12/31/23 1435)     Epidural/Spinal Function Cutaneous sensation: Tingles (12/31/23 1435), Patient able to flex knees: Yes (12/31/23 1435), Patient able to lift hips off bed: Yes (12/31/23 1435), Back pain beyond tenderness at insertion site: No (12/31/23 1435), Progressively worsening motor and/or sensory loss: No (12/31/23 1435), Bowel and/or bladder incontinence post epidural: No (12/31/23 1435)  Shreshta Medley

## 2023-12-31 NOTE — Lactation Note (Signed)
 This note was copied from a baby's chart. Lactation Consultation Note  Patient Name: Carol Buck ZOXWR'U Date: 12/31/2023 Age:22 hours Reason for consult: Initial assessment;Primapara;1st time breastfeeding;Term  P1- MOB reports that infant has nursed twice since birth and both times have gone very well. MOB denies experiencing any pain or pinching when infant nurses. MOB had multiple visitors at this time, so LC encouraged MOB to call out for a latch assessment and a flange sizing at some point today. MOB verbalized agreement. MOB denies having any questions or concerns at this time.  LC reviewed feeding infant on cue 8-12x in 24 hrs, not allowing infant to go over 3 hrs without a feeding, CDC milk storage guidelines and LC services handout. LC encouraged MOB to call for further assistance as needed.  Maternal Data Has patient been taught Hand Expression?: No Does the patient have breastfeeding experience prior to this delivery?: No  Feeding Mother's Current Feeding Choice: Breast Milk  Lactation Tools Discussed/Used Pump Education: Milk Storage  Interventions Interventions: Breast feeding basics reviewed;Education;LC Services brochure  Discharge Discharge Education: Warning signs for feeding baby Pump: Hands Free;Personal  Consult Status Consult Status: Follow-up Date: 01/01/24 Follow-up type: In-patient    Dema Severin BS, IBCLC 12/31/2023, 5:04 PM

## 2024-01-01 LAB — CBC
HCT: 23.8 % — ABNORMAL LOW (ref 36.0–46.0)
Hemoglobin: 7.7 g/dL — ABNORMAL LOW (ref 12.0–15.0)
MCH: 25.4 pg — ABNORMAL LOW (ref 26.0–34.0)
MCHC: 32.4 g/dL (ref 30.0–36.0)
MCV: 78.5 fL — ABNORMAL LOW (ref 80.0–100.0)
Platelets: 283 10*3/uL (ref 150–400)
RBC: 3.03 MIL/uL — ABNORMAL LOW (ref 3.87–5.11)
RDW: 14.6 % (ref 11.5–15.5)
WBC: 16.4 10*3/uL — ABNORMAL HIGH (ref 4.0–10.5)
nRBC: 0 % (ref 0.0–0.2)

## 2024-01-01 MED ORDER — SODIUM CHLORIDE 0.9 % IV SOLN
500.0000 mg | Freq: Once | INTRAVENOUS | Status: AC
  Administered 2024-01-01: 500 mg via INTRAVENOUS
  Filled 2024-01-01: qty 25

## 2024-01-01 MED ORDER — IRON SUCROSE 500 MG IVPB - SIMPLE MED
500.0000 mg | Freq: Once | INTRAVENOUS | Status: DC
  Filled 2024-01-01: qty 275

## 2024-01-01 NOTE — Progress Notes (Addendum)
 Subjective: Carol Buck is a 23 y.o. G2P1011 s/p pLTCS at [redacted]w[redacted]d.  She reports she doing well. No acute events overnight. She denies any problems with ambulating, voiding or po intake. Denies nausea or vomiting. She has not  passed flatus. Pain is well controlled.  Lochia is moderate.  Objective: Blood pressure (!) 90/49, pulse 71, temperature 98.2 F (36.8 C), temperature source Oral, resp. rate 18, height 5\' 7"  (1.702 m), weight 68.8 kg, last menstrual period 04/01/2023, SpO2 98%, unknown if currently breastfeeding.  Physical Exam:  General: alert, cooperative and no distress Chest: no respiratory distress Abdomen: soft, non-tender  Uterine Fundus: firm and at level of umbilicus Extremities: No calf swelling or tenderness  no edema  Recent Labs    12/30/23 1100 01/01/24 0430  HGB 9.6* 7.7*  HCT 31.0* 23.8*    Assessment/Plan: Carol Buck is a 23 y.o. G2P1011 s/p LTCS at [redacted]w[redacted]d for breech presentation (declined ECV).  Routine Postpartum Care: Doing well, pain well-controlled.  -- Continue routine care, lactation support  -- Contraception: declines -- Feeding: breast  #Anemia - Hg 9.6 to 7.7 today - will order venofer after discussion of options with patient.  Dispo: Plan for discharge tomorrow.  Claria Dice, MD Family Medicine Resident, PGY-1 01/01/2024 7:56 AM   GME ATTESTATION:  Evaluation and management procedures were performed by the [facility] Medicine Resident under my supervision. I was immediately available for direct supervision, assistance and direction throughout this encounter.  I also confirm that I have verified the information documented in the resident's note, and that I have also personally reperformed the pertinent components of the physical exam and all of the medical decision making activities.  I have also made any necessary editorial changes.  Wyn Forster, MD OB Fellow, Faculty [facility], Center for [facility] 01/01/2024 11:24  AM

## 2024-01-01 NOTE — Social Work (Signed)
 CSW received a consult for MOB hx of ADHD, per chart review MOB diagnosis dates back to 2016. No concerns noted during this pregnancy. Please contact the Clinical Social Worker if needs arise, by [facility] request, or if MOB scores greater than 9/yes to question 10 on [place] Postpartum Depression Screen.  Wende Neighbors, LCSWA Clinical Social Worker [phone]

## 2024-01-02 ENCOUNTER — Encounter: Payer: Medicaid Other | Admitting: Women's Health

## 2024-01-02 MED ORDER — LORATADINE 10 MG PO TABS
10.0000 mg | ORAL_TABLET | Freq: Every day | ORAL | Status: DC
  Administered 2024-01-02 – 2024-01-03 (×2): 10 mg via ORAL
  Filled 2024-01-02 (×2): qty 1

## 2024-01-02 NOTE — Lactation Note (Signed)
 This note was copied from a baby's chart. Lactation Consultation Note  Patient Name: Carol Buck ZOXWR'U Date: 01/02/2024 Age:22 hours Reason for consult: Follow-up assessment;Primapara;1st time breastfeeding;Term;Infant weight loss;Breastfeeding assistance  P1- Infant has had a weight loss of 6.84% on day 2. MOB/FOB informed LC that they were concerned over infant's weight loss, so they recently started supplementing with formula after breast feedings to make sure infant is getting enough. MOB had infant latched to the left breast in the cradle hold at this time. Infant was faced belly up. LC recommended changing the positioning. LC assisted with placing infant belly to belly and nose to nipple. Infant's arms were hugging the breast. Infant latched immediately. LC noted that her bottom lip was tucked, so LC demonstrated how to flange infant's lips. MOB reported that this felt a lot better than what infant has been doing.  LC reccommended trying to hand express the other breast to see if colostrum is noted. MOB was in agreement. LC expressed once and multiple streams of colostrum shot across the bed. LC encouraged MOB to pump EBM and back feed infant instead if she does not want to offer formula. MOB reports that she is okay with formula for now. LC reviewed breast/engorgement care and encouraged MOB to call for further assistance as needed.  Maternal Data Has patient been taught Hand Expression?: Yes Does the patient have breastfeeding experience prior to this delivery?: No  Feeding Mother's Current Feeding Choice: Breast Milk and Formula Nipple Type: Slow - flow  LATCH Score Latch: Grasps breast easily, tongue down, lips flanged, rhythmical sucking.  Audible Swallowing: Spontaneous and intermittent  Type of Nipple: Everted at rest and after stimulation  Comfort (Breast/Nipple): Soft / non-tender  Hold (Positioning): Assistance needed to correctly position infant at breast and  maintain latch.  LATCH Score: 9   Lactation Tools Discussed/Used Pump Education: Milk Storage  Interventions Interventions: Breast feeding basics reviewed;Assisted with latch;Hand express;Breast compression;Adjust position;Support pillows;Position options;Expressed milk;DEBP;Education;LC Services brochure  Discharge Discharge Education: Warning signs for feeding baby;Engorgement and breast care Pump: DEBP;Personal  Consult Status Consult Status: Follow-up Date: 01/03/24 Follow-up type: In-patient    Dema Severin BS, IBCLC 01/02/2024, 5:01 PM

## 2024-01-02 NOTE — Progress Notes (Signed)
 POSTPARTUM PROGRESS NOTE   Subjective: Carol Buck is a 23 y.o. G2P1011 POD#2 s/p pLTCS at [redacted]w[redacted]d.  She reports she doing well. No acute events overnight. She denies any problems with ambulating, voiding or po intake. Denies nausea or vomiting. She has passed flatus. Pain is well controlled.  Lochia is moderate. Baby girl is stable at bedside, FOB is at bedside.  Objective: Blood pressure 121/72, pulse 81, temperature 98.3 F (36.8 C), temperature source Oral, resp. rate 18, height 5\' 7"  (1.702 m), weight 68.8 kg, last menstrual period 04/01/2023, SpO2 99%, unknown if currently breastfeeding.  Physical Exam:  General: alert, cooperative and no distress Chest: no respiratory distress Abdomen: soft, non-tender  Uterine Fundus: firm and at level of umbilicus Extremities: No calf swelling or tenderness  no edema  Recent Labs    12/30/23 1100 01/01/24 0430  HGB 9.6* 7.7*  HCT 31.0* 23.8*    Assessment/Plan: Carol Buck is a 23 y.o. G2P1011 POD#2 s/p LTCS at [redacted]w[redacted]d for breech presentation (declined ECV).  Routine Postpartum Care: Doing well, pain well-controlled. Continue analgesia as needed. -- Continue routine care, lactation support  -- Contraception: declines -- Feeding: breast  #Anemia - Hgb 9.6 to 7.7 today, s/p Venofer on 01/01/24  Dispo: Plan for discharge tomorrow.  Jaynie Collins, MD OB Attending, Faculty Practice [facility], Center for [facility] 01/02/2024 9:15 AM

## 2024-01-03 ENCOUNTER — Ambulatory Visit (HOSPITAL_COMMUNITY): Payer: Self-pay

## 2024-01-03 MED ORDER — OXYCODONE HCL 5 MG PO TABS: 5.0000 mg | ORAL_TABLET | ORAL | 0 refills | Status: DC | PRN

## 2024-01-03 MED ORDER — IBUPROFEN 600 MG PO TABS: 600.0000 mg | ORAL_TABLET | Freq: Four times a day (QID) | ORAL | 0 refills | Status: DC

## 2024-01-03 NOTE — Lactation Note (Signed)
 This note was copied from a baby's chart. Lactation Consultation Note  Patient Name: Carol Buck ZOXWR'U Date: 01/03/2024 Age:22 hours  Reason for consult: Follow-up assessment;1st time breastfeeding;Primapara  P1, [redacted]w[redacted]d, 6% weight loss  Follow up visit. Mother's breast are full. She reports baby is breastfeeding well. Discussed managing her milk supply and engorgement. Mother is breastfeeding ad lib and has not given formula. She denies any questions or concerns.  Mom made aware of O/P services, breastfeeding support groups, and our phone # for post-discharge questions.     Feeding Mother's Current Feeding Choice: Breast Milk and Formula   Consult Status Consult Status: Complete Date: 01/03/24    Omar Person 01/03/2024, 2:38 PM

## 2024-01-03 NOTE — Discharge Summary (Signed)
 Postpartum Discharge Summary       Patient Name: Carol Buck DOB: January 27, 2001 MRN: [id number]  Date of admission: 12/31/2023 Delivery date:12/31/2023 Delivering provider: Shonna Chock [place] Date of discharge: 01/03/2024  Admitting diagnosis: Pregnancy [Z34.90] Intrauterine pregnancy: [redacted]w[redacted]d     Secondary diagnosis:  Principal Problem:   Pregnancy Active Problems:   Anemia affecting pregnancy  Additional problems: none    Discharge diagnosis: Term Pregnancy Delivered                                              Post partum procedures:IV iron Augmentation: N/A Complications: None  Hospital course: Sceduled C/S   23 y.o. yo G2P1011 at [redacted]w[redacted]d was admitted to the hospital 12/31/2023 for scheduled cesarean section with the following indication:Malpresentation.Delivery details are as follows:  Membrane Rupture Time/Date: 1:22 PM,12/31/2023  Delivery Method:C-Section, Low Transverse Operative Delivery:N/A Details of operation can be found in separate operative note.  Patient had a postpartum course complicated by anemia of pregnancy, blood loss.  She is ambulating, tolerating a regular diet, passing flatus, and urinating well. Patient is discharged home in stable condition on  01/03/24        Newborn Data: Birth date:12/31/2023 Birth time:1:23 PM Gender:Female Living status:Living Apgars:9 ,9  Weight:3070 g    Magnesium Sulfate received: No BMZ received: No Rhophylac:N/A MMR:N/A T-DaP: declined Flu: N/A RSV Vaccine received: No Transfusion:No  Immunizations received: There is no immunization history for the selected administration types on file for this patient.  Physical exam  Vitals:   01/02/24 0955 01/02/24 1327 01/02/24 2136 01/03/24 0400  BP: 123/68 113/66 111/77 112/64  Pulse: 93  96 73  Resp: 16 16 18 17   Temp: 97.9 F (36.6 C) 97.7 F (36.5 C) 99 F (37.2 C) 97.7 F (36.5 C)  TempSrc: Oral Oral Oral Oral  SpO2: 99%     Weight:      Height:        General: alert, cooperative, and no distress Lochia: appropriate Uterine Fundus: firm Incision: Healing well with no significant drainage, No significant erythema, Dressing is clean, dry, and intact DVT Evaluation: No evidence of DVT seen on physical exam. Negative Homan's sign. No significant calf/ankle edema. Labs: Lab Results  Component Value Date   WBC 16.4 (H) 01/01/2024   HGB 7.7 (L) 01/01/2024   HCT 23.8 (L) 01/01/2024   MCV 78.5 (L) 01/01/2024   PLT 283 01/01/2024       No data to display         Edinburgh Score:    01/02/2024    2:34 PM  Edinburgh Postnatal Depression Scale Screening Tool  I have been able to laugh and see the funny side of things. 0  I have looked forward with enjoyment to things. 0  I have blamed myself unnecessarily when things went wrong. 1  I have been anxious or worried for no good reason. 0  I have felt scared or panicky for no good reason. 0  Things have been getting on top of me. 0  I have been so unhappy that I have had difficulty sleeping. 0  I have felt sad or miserable. 0  I have been so unhappy that I have been crying. 0  The thought of harming myself has occurred to me. 0  Edinburgh Postnatal Depression Scale Total 1   Edinburgh Postnatal Depression Scale  Total: 1   After visit meds:  Allergies as of 01/03/2024   No Known Allergies      Medication List     STOP taking these medications    Blood Pressure Monitor Misc       TAKE these medications    ibuprofen 600 MG tablet Commonly known as: ADVIL Take 1 tablet (600 mg total) by mouth every 6 (six) hours.   oxyCODONE 5 MG immediate release tablet Commonly known as: Oxy IR/ROXICODONE Take 1 tablet (5 mg total) by mouth every 4 (four) hours as needed for moderate pain (pain score 4-6).         Discharge home in stable condition Infant Feeding: Breast Infant Disposition:home with mother Discharge instruction: per After Visit Summary and Postpartum  booklet. Activity: Advance as tolerated. Pelvic rest for 6 weeks.  Diet: routine diet Future Appointments: Future Appointments  Date Time Provider Department Center  01/08/2024 11:50 AM Arabella Merles, CNM [facility]  02/05/2024  1:50 PM Arabella Merles, CNM [facility]   Follow up Visit:   Please schedule this patient for a In person postpartum visit in 4 weeks with the following provider: Any provider. Additional Postpartum F/U:  Low risk pregnancy complicated by:  Delivery mode:  C-Section, Low Transverse Anticipated Birth Control:  Unsure   01/03/2024 Carol Buck, CNM

## 2024-01-08 ENCOUNTER — Ambulatory Visit (INDEPENDENT_AMBULATORY_CARE_PROVIDER_SITE_OTHER): Payer: Medicaid Other | Admitting: Advanced Practice Midwife

## 2024-01-08 ENCOUNTER — Encounter: Payer: Self-pay | Admitting: Advanced Practice Midwife

## 2024-01-08 VITALS — BP 105/70 | HR 78 | Ht 67.0 in | Wt 142.0 lb

## 2024-01-08 DIAGNOSIS — Z4889 Encounter for other specified surgical aftercare: Secondary | ICD-10-CM

## 2024-01-08 NOTE — Progress Notes (Signed)
   GYN VISIT Patient name: Carol Buck MRN [id number]  Date of birth: [birth year] Chief Complaint:   Post-op Follow-up (1 wk incision check)  History of Present Illness:   Carol Buck is a 23 y.o. G54P1011 Caucasian female being seen today for incision check s/p pLTCS x 8d ago. Taking ibuprofen routinely with maybe 1 oxy/day. Doing well overall. Breastfeeding and still with vag bldg.     Patient's last menstrual period was 04/01/2023. The current method of family planning is abstinence.  Last pap 12/12/23. Results were: NILM w/ HRHPV not done     12/12/2023    3:40 PM 01/02/2019   10:42 AM  Depression screen PHQ 2/9  Decreased Interest 0 0  Down, Depressed, Hopeless 0 0  PHQ - 2 Score 0 0  Altered sleeping 1   Tired, decreased energy 1   Change in appetite 0   Feeling bad or failure about yourself  0   Trouble concentrating 0   Moving slowly or fidgety/restless 0   Suicidal thoughts 0   PHQ-9 Score 2         12/12/2023    3:41 PM  GAD 7 : Generalized Anxiety Score  Nervous, Anxious, on Edge 0  Control/stop worrying 0  Worry too much - different things 0  Trouble relaxing 0  Restless 0  Easily annoyed or irritable 0  Afraid - awful might happen 0  Total GAD 7 Score 0     Review of Systems:   Pertinent items are noted in HPI Denies fever/chills, dizziness, headaches, visual disturbances, fatigue, shortness of breath, chest pain, abdominal pain, vomiting, abnormal vaginal discharge/itching/odor/irritation, problems with periods, bowel movements, urination, or intercourse unless otherwise stated above.  Pertinent History Reviewed:  Reviewed past medical,surgical, social, obstetrical and family history.  Reviewed problem list, medications and allergies. Physical Assessment:   Vitals:   01/08/24 1155  BP: 105/70  Pulse: 78  Weight: 142 lb (64.4 kg)  Height: 5\' 7"  (1.702 m)  Body mass index is 22.24 kg/m.       Physical Examination:   General appearance: alert, well  appearing, and in no distress  Mental status: alert, oriented to person, place, and time  Skin: warm & dry   Cardiovascular: normal heart rate noted  Respiratory: normal respiratory effort, no distress  Abdomen: soft, appropriately tender; LTCS incision healing without erythema or s/s infection  Pelvic: examination not indicated  Extremities: no edema    No results found for this or any previous visit (from the past 24 hours).  Assessment & Plan:  1) s/p pLTCS x 8d ago> incision appropriately healing without s/s infection  2) Considering POPs for contraception> will review @ PP visit  Meds: No orders of the defined types were placed in this encounter.   No orders of the defined types were placed in this encounter.   Return for As scheduled.  Arabella Merles CNM 01/08/2024 12:09 PM

## 2024-02-05 ENCOUNTER — Ambulatory Visit: Payer: Medicaid Other | Admitting: Advanced Practice Midwife

## 2024-02-05 ENCOUNTER — Encounter: Payer: Self-pay | Admitting: Advanced Practice Midwife

## 2024-02-05 DIAGNOSIS — Z98891 History of uterine scar from previous surgery: Secondary | ICD-10-CM

## 2024-02-05 MED ORDER — SLYND 4 MG PO TABS: 1.0000 | ORAL_TABLET | Freq: Every day | ORAL | 4 refills | Status: DC

## 2024-02-05 NOTE — Progress Notes (Signed)
 POSTPARTUM VISIT Patient name: Carol Buck MRN [id number]  Date of birth: October 26, 2001 Chief Complaint:   Postpartum Care  History of Present Illness:   Carol Buck is a 23 y.o. G46P1011 Caucasian female being seen today for a postpartum visit. She is 5 weeks postpartum following a primary cesarean section, low transverse incision at 39.1 gestational weeks. IOL: no, for n/a. Anesthesia: spinal.  Laceration: n/a.  Complications: breech presentation; declined ECV. Inpatient contraception: no.   Pregnancy uncomplicated. Tobacco use: former . Substance use disorder: no. Last pap smear: Feb 2025 and results were NILM w/ HRHPV not done. Next pap smear due: Feb 2028 Patient's last menstrual period was 04/01/2023.  Postpartum course has been uncomplicated. Bleeding none. Bowel function is normal. Bladder function is normal. Urinary incontinence? no, fecal incontinence? no Patient is sexually active. Last sexual activity:  once since delivery . Desired contraception: POPs. Patient does want a pregnancy in the future.  Desired family size is 2 children.   Upstream - 02/05/24 1441       Pregnancy Intention Screening   Does the patient want to become pregnant in the next year? No    Does the patient's partner want to become pregnant in the next year? No    Would the patient like to discuss contraceptive options today? Yes      Contraception Wrap Up   Current Method Oral Contraceptive    End Method Oral Contraceptive    Contraception Counseling Provided Yes            The pregnancy intention screening data noted above was reviewed. Potential methods of contraception were discussed. The patient elected to proceed with Oral Contraceptive.  Edinburgh Postpartum Depression Screening: negative  Edinburgh Postnatal Depression Scale - 02/05/24 1359       Edinburgh Postnatal Depression Scale:  In the Past 7 Days   I have been able to laugh and see the funny side of things. 0    I have looked  forward with enjoyment to things. 0    I have blamed myself unnecessarily when things went wrong. 1    I have been anxious or worried for no good reason. 3    I have felt scared or panicky for no good reason. 0    Things have been getting on top of me. 0    I have been so unhappy that I have had difficulty sleeping. 0    I have felt sad or miserable. 0    I have been so unhappy that I have been crying. 0    The thought of harming myself has occurred to me. 0    Edinburgh Postnatal Depression Scale Total 4                12/12/2023    3:41 PM  GAD 7 : Generalized Anxiety Score  Nervous, Anxious, on Edge 0  Control/stop worrying 0  Worry too much - different things 0  Trouble relaxing 0  Restless 0  Easily annoyed or irritable 0  Afraid - awful might happen 0  Total GAD 7 Score 0     Baby's course has been uncomplicated. Baby is feeding by breast: milk supply adequate. Infant has a pediatrician/family doctor? Yes.  Childcare strategy if returning to work/school: n/a-stay at home mom.  Pt has material needs met for her and baby: Yes.   Review of Systems:   Pertinent items are noted in HPI Denies Abnormal vaginal discharge w/ itching/odor/irritation, headaches, visual changes, shortness  of breath, chest pain, abdominal pain, severe nausea/vomiting, or problems with urination or bowel movements. Pertinent History Reviewed:  Reviewed past medical,surgical, obstetrical and family history.  Reviewed problem list, medications and allergies. OB History  Gravida Para Term Preterm AB Living  2 1 1  0 1 1  SAB IAB Ectopic Multiple Live Births  1 0 0 0 1    # Outcome Date GA Lbr Len/2nd Weight Sex Type Anes PTL Lv  2 Term 12/31/23 [redacted]w[redacted]d  6 lb 12.3 oz (3.07 kg) F CS-LTranv Spinal  LIV  1 SAB 11/08/22 [redacted]w[redacted]d          Physical Assessment:   Vitals:   02/05/24 1402  BP: (!) 96/57  Pulse: 84  Weight: 136 lb (61.7 kg)  Height: 5\' 7"  (1.702 m)  Body mass index is 21.3 kg/m.        Physical Examination:   General appearance: alert, well appearing, and in no distress  Mental status: alert, oriented to person, place, and time  Skin: warm & dry   Cardiovascular: normal heart rate noted   Respiratory: normal respiratory effort, no distress   Breasts: deferred, no complaints   Abdomen: soft, approp tender; LTCS incision healing; has some scabbing on it but no s/s infection  Pelvic: examination not indicated. Thin prep pap obtained: No  Rectal: not examined  Extremities: Edema: none         No results found for this or any previous visit (from the past 24 hours).  Assessment & Plan:  1) Postpartum exam 2) Five wks s/p primary cesarean section, low transverse incision 3) breast feeding 4) Depression screening: EPDS 4, GAD 0; feels well 5) Contraception management: she isn't totally sure about taking contraception, but requested POPs just in case; given Slynd #3 sample packs with rx sent; instructed to take daily with backup x first month ; declines f/u contraceptive visit- will call with questions  Essential components of care per ACOG recommendations:  1.  Mood and well being:  If positive depression screen, discussed and plan developed.  If using tobacco we discussed reduction/cessation and risk of relapse If current substance abuse, we discussed and referral to local resources was offered.   2. Infant care and feeding:  If breastfeeding, discussed returning to work, pumping, breastfeeding-associated pain, guidance regarding return to fertility while lactating if not using another method. If needed, patient was provided with a letter to be allowed to pump q 2-3hrs to support lactation in a private location with access to a refrigerator to store breastmilk.   Recommended that all caregivers be immunized for flu, pertussis and other preventable communicable diseases If pt does not have material needs met for her/baby, referred to local resources for help obtaining  these.  3. Sexuality, contraception and birth spacing Provided guidance regarding sexuality, management of dyspareunia, and resumption of intercourse Discussed avoiding interpregnancy interval <70mths and recommended birth spacing of 18 months  4. Sleep and fatigue Discussed coping options for fatigue and sleep disruption Encouraged family/partner/community support of 4 hrs of uninterrupted sleep to help with mood and fatigue  5. Physical recovery  If pt had a C/S, assessed incisional pain and providing guidance on normal vs prolonged recovery If pt had a laceration, perineal healing and pain reviewed.  If urinary or fecal incontinence, discussed management and referred to PT or uro/gyn if indicated  Patient is safe to resume physical activity. Discussed attainment of healthy weight.  6.  Chronic disease management Discussed pregnancy complications if any,  and their implications for future childbearing and long-term maternal health. Review recommendations for prevention of recurrent pregnancy complications, such as 17 hydroxyprogesterone caproate to reduce risk for recurrent PTB not applicable, or aspirin to reduce risk of preeclampsia not applicable. Pt had GDM: no. If yes, 2hr GTT scheduled: not applicable. Reviewed medications and non-pregnant dosing including consideration of whether pt is breastfeeding using a reliable resource such as LactMed: yes Referred for f/u w/ PCP or subspecialist providers as indicated: not applicable  7. Health maintenance Mammogram at 23yo or earlier if indicated Pap smears as indicated  Meds:  Meds ordered this encounter  Medications   DISCONTD: Drospirenone (SLYND) 4 MG TABS    Sig: Take 1 tablet (4 mg total) by mouth daily.    Dispense:  84 tablet    Refill:  4    Supervising Provider:   Myna Hidalgo [[id number]]   Drospirenone (SLYND) 4 MG TABS    Sig: Take 1 tablet (4 mg total) by mouth daily.    Dispense:  84 tablet    Refill:  4     Supervising Provider:   Myna Hidalgo [[id number]]    Follow-up: Return for prn.   No orders of the defined types were placed in this encounter.   Carol Buck CNM 02/05/2024 2:42 PM

## 2024-02-05 NOTE — Patient Instructions (Signed)
 Use the resource www.postpartum.net for helpful postpartum information.  Also you can use the website [employer]act Med to see what medications you can take with breastfeeding.

## 2024-08-26 ENCOUNTER — Encounter: Payer: Self-pay | Admitting: Women's Health

## 2024-09-23 ENCOUNTER — Ambulatory Visit: Admitting: *Deleted

## 2024-09-23 VITALS — BP 127/87 | HR 109 | Ht 67.0 in | Wt 120.0 lb

## 2024-09-23 DIAGNOSIS — Z3201 Encounter for pregnancy test, result positive: Secondary | ICD-10-CM | POA: Diagnosis not present

## 2024-09-23 DIAGNOSIS — N926 Irregular menstruation, unspecified: Secondary | ICD-10-CM

## 2024-09-23 LAB — POCT URINE PREGNANCY: Preg Test, Ur: POSITIVE — AB

## 2024-09-23 NOTE — Progress Notes (Signed)
   NURSE VISIT- PREGNANCY CONFIRMATION   SUBJECTIVE:  Carol Buck is a 23 y.o. [phone] female at [redacted]w[redacted]d by certain LMP of Patient's last menstrual period was 08/01/2024. Here for pregnancy confirmation.  Home pregnancy test: positive x 1  She reports no complaints.  She is not taking prenatal vitamins.    OBJECTIVE:  BP 127/87 (BP Location: Right Arm, Patient Position: Sitting, Cuff Size: Normal)   Pulse (!) 109   Ht 5' 7 (1.702 m)   Wt 120 lb (54.4 kg)   LMP 08/01/2024   Breastfeeding No   BMI 18.79 kg/m   Appears well, in no apparent distress  Results for orders placed or performed in visit on 09/23/24 (from the past 24 hours)  POCT urine pregnancy   Collection Time: 09/23/24  4:21 PM  Result Value Ref Range   Preg Test, Ur Positive (A) Negative    ASSESSMENT: Positive pregnancy test, [redacted]w[redacted]d by LMP    PLAN: Schedule for dating ultrasound in 1 week Prenatal vitamins: plans to begin OTC ASAP   Nausea medicines: not currently needed   OB packet given: Yes  Eliza Green  09/23/2024 4:22 PM

## 2024-09-28 ENCOUNTER — Other Ambulatory Visit: Payer: Self-pay | Admitting: Obstetrics & Gynecology

## 2024-09-28 DIAGNOSIS — O3680X0 Pregnancy with inconclusive fetal viability, not applicable or unspecified: Secondary | ICD-10-CM

## 2024-09-29 ENCOUNTER — Ambulatory Visit (INDEPENDENT_AMBULATORY_CARE_PROVIDER_SITE_OTHER)

## 2024-09-29 ENCOUNTER — Other Ambulatory Visit: Payer: Self-pay | Admitting: Women's Health

## 2024-09-29 ENCOUNTER — Telehealth: Payer: Self-pay | Admitting: *Deleted

## 2024-09-29 DIAGNOSIS — O3680X0 Pregnancy with inconclusive fetal viability, not applicable or unspecified: Secondary | ICD-10-CM

## 2024-09-29 DIAGNOSIS — Z3A01 Less than 8 weeks gestation of pregnancy: Secondary | ICD-10-CM

## 2024-09-29 MED ORDER — PROGESTERONE 200 MG PO CAPS
200.0000 mg | ORAL_CAPSULE | Freq: Every day | ORAL | 5 refills | Status: AC
Start: 2024-09-29 — End: ?

## 2024-09-29 NOTE — Progress Notes (Signed)
 US  6+4 wks,single IUP with yolk sac,CRL 7.47 mm,FHR 115 bpm,normal ovaries

## 2024-09-29 NOTE — Telephone Encounter (Signed)
 Patient here for dating ultrasound.  History of SABx2. Prior pregnancy was on progesterone . Requesting to be placed on this again.

## 2024-11-09 ENCOUNTER — Encounter: Payer: Self-pay | Admitting: Women's Health

## 2024-11-09 DIAGNOSIS — Z3490 Encounter for supervision of normal pregnancy, unspecified, unspecified trimester: Secondary | ICD-10-CM | POA: Insufficient documentation

## 2024-11-10 ENCOUNTER — Encounter: Admitting: *Deleted

## 2024-11-10 ENCOUNTER — Other Ambulatory Visit (HOSPITAL_COMMUNITY)
Admission: RE | Admit: 2024-11-10 | Discharge: 2024-11-10 | Disposition: A | Discharge status: Home / Self Care | Source: Ambulatory Visit | Attending: Women's Health | Admitting: Women's Health

## 2024-11-10 ENCOUNTER — Ambulatory Visit: Admitting: Women's Health

## 2024-11-10 ENCOUNTER — Encounter: Payer: Self-pay | Admitting: Women's Health

## 2024-11-10 VITALS — BP 101/69 | HR 96 | Wt 127.0 lb

## 2024-11-10 DIAGNOSIS — O34219 Maternal care for unspecified type scar from previous cesarean delivery: Secondary | ICD-10-CM

## 2024-11-10 DIAGNOSIS — Z3A12 12 weeks gestation of pregnancy: Secondary | ICD-10-CM

## 2024-11-10 DIAGNOSIS — Z131 Encounter for screening for diabetes mellitus: Secondary | ICD-10-CM

## 2024-11-10 DIAGNOSIS — Z113 Encounter for screening for infections with a predominantly sexual mode of transmission: Secondary | ICD-10-CM | POA: Insufficient documentation

## 2024-11-10 DIAGNOSIS — Z1332 Encounter for screening for maternal depression: Secondary | ICD-10-CM | POA: Diagnosis not present

## 2024-11-10 DIAGNOSIS — O2621 Pregnancy care for patient with recurrent pregnancy loss, first trimester: Secondary | ICD-10-CM | POA: Diagnosis not present

## 2024-11-10 DIAGNOSIS — Z3480 Encounter for supervision of other normal pregnancy, unspecified trimester: Secondary | ICD-10-CM | POA: Insufficient documentation

## 2024-11-10 DIAGNOSIS — Z3481 Encounter for supervision of other normal pregnancy, first trimester: Secondary | ICD-10-CM

## 2024-11-10 NOTE — Patient Instructions (Signed)
 Carol Buck, thank you for choosing our office today! We appreciate the opportunity to meet your healthcare needs. You may receive a short survey by mail, e-mail, or through [employer]. If you are happy with your care we would appreciate if you could take just a few minutes to complete the survey questions. We read all of your comments and take your feedback very seriously. Thank you again for choosing our office.  [facility] [employer] [facility] Team at [facility]  [facility] ([address] [place], [place] [address]) Entrance C, located off of [address] Free 24/7 valet parking   Nausea & Vomiting Have saltine crackers or pretzels by your bed and eat a few bites before you raise your head out of bed in the morning Eat small frequent meals throughout the day instead of large meals Drink plenty of fluids throughout the day to stay hydrated, just don't drink a lot of fluids with your meals.  This can make your stomach fill up faster making you feel sick Do not brush your teeth right after you eat Products with real ginger are good for nausea, like ginger ale and ginger hard candy Make sure it says made with real ginger! Sucking on sour candy like lemon heads is also good for nausea If your prenatal vitamins make you nauseated, take them at night so you will sleep through the nausea Sea Bands If you feel like you need medicine for the nausea & vomiting please let us know If you are unable to keep any fluids or food down please let us know   Constipation Drink plenty of fluid, preferably water, throughout the day Eat foods high in fiber such as fruits, vegetables, and grains Exercise, such as walking, is a good way to keep your bowels regular Drink warm fluids, especially warm prune juice, or decaf coffee Eat a 1/2 cup of real oatmeal (not instant), 1/2 cup applesauce, and 1/2-1 cup warm prune juice every day If needed, you may take Colace (docusate sodium) stool softener  once or twice a day to help keep the stool soft.  If you still are having problems with constipation, you may take Miralax once daily as needed to help keep your bowels regular.   Home Blood Pressure Monitoring for Patients   Your provider has recommended that you check your blood pressure (BP) at least once a week at home. If you do not have a blood pressure cuff at home, one will be provided for you. Contact your provider if you have not received your monitor within 1 week.   Helpful Tips for Accurate Home Blood Pressure Checks  Don't smoke, exercise, or drink caffeine 30 minutes before checking your BP Use the restroom before checking your BP (a full bladder can raise your pressure) Relax in a comfortable upright chair Feet on the ground Left arm resting comfortably on a flat surface at the level of your heart Legs uncrossed Back supported Sit quietly and don't talk Place the cuff on your bare arm Adjust snuggly, so that only two fingertips can fit between your skin and the top of the cuff Check 2 readings separated by at least one minute Keep a log of your BP readings For a visual, please reference this diagram: http://ccnc.care/bpdiagram  Provider Name: [facility]     Phone: [phone]  Zone 1: ALL CLEAR  Continue to monitor your symptoms:  BP reading is less than 140 (top number) or less than 90 (bottom  number)  No right upper stomach pain No headaches or seeing spots No feeling nauseated or throwing up No swelling in face and hands  Zone 2: CAUTION Call your doctor's office for any of the following:  BP reading is greater than 140 (top number) or greater than 90 (bottom number)  Stomach pain under your ribs in the middle or right side Headaches or seeing spots Feeling nauseated or throwing up Swelling in face and hands  Zone 3: EMERGENCY  Seek immediate medical care if you have any of the following:  BP reading is greater than160 (top number) or greater than  110 (bottom number) Severe headaches not improving with Tylenol Serious difficulty catching your breath Any worsening symptoms from Zone 2    First Trimester of Pregnancy The first trimester of pregnancy is from week 1 until the end of week 12 (months 1 through 3). A week after a sperm fertilizes an egg, the egg will implant on the wall of the uterus. This embryo will begin to develop into a baby. Genes from you and your partner are forming the baby. The female genes determine whether the baby is a boy or a girl. At 6-8 weeks, the eyes and face are formed, and the heartbeat can be seen on ultrasound. At the end of 12 weeks, all the baby's organs are formed.  Now that you are pregnant, you will want to do everything you can to have a healthy baby. Two of the most important things are to get good prenatal care and to follow your health care provider's instructions. Prenatal care is all the medical care you receive before the baby's birth. This care will help prevent, find, and treat any problems during the pregnancy and childbirth. BODY CHANGES Your body goes through many changes during pregnancy. The changes vary from woman to woman.  You may gain or lose a couple of pounds at first. You may feel sick to your stomach (nauseous) and throw up (vomit). If the vomiting is uncontrollable, call your health care provider. You may tire easily. You may develop headaches that can be relieved by medicines approved by your health care provider. You may urinate more often. Painful urination may mean you have a bladder infection. You may develop heartburn as a result of your pregnancy. You may develop constipation because certain hormones are causing the muscles that push waste through your intestines to slow down. You may develop hemorrhoids or swollen, bulging veins (varicose veins). Your breasts may begin to grow larger and become tender. Your nipples may stick out more, and the tissue that surrounds them  (areola) may become darker. Your gums may bleed and may be sensitive to brushing and flossing. Dark spots or blotches (chloasma, mask of pregnancy) may develop on your face. This will likely fade after the baby is born. Your menstrual periods will stop. You may have a loss of appetite. You may develop cravings for certain kinds of food. You may have changes in your emotions from day to day, such as being excited to be pregnant or being concerned that something may go wrong with the pregnancy and baby. You may have more vivid and strange dreams. You may have changes in your hair. These can include thickening of your hair, rapid growth, and changes in texture. Some women also have hair loss during or after pregnancy, or hair that feels dry or thin. Your hair will most likely return to normal after your baby is born. WHAT TO EXPECT AT YOUR PRENATAL  VISITS During a routine prenatal visit: You will be weighed to make sure you and the baby are growing normally. Your blood pressure will be taken. Your abdomen will be measured to track your baby's growth. The fetal heartbeat will be listened to starting around week 10 or 12 of your pregnancy. Test results from any previous visits will be discussed. Your health care provider may ask you: How you are feeling. If you are feeling the baby move. If you have had any abnormal symptoms, such as leaking fluid, bleeding, severe headaches, or abdominal cramping. If you have any questions. Other tests that may be performed during your first trimester include: Blood tests to find your blood type and to check for the presence of any previous infections. They will also be used to check for low iron levels (anemia) and Rh antibodies. Later in the pregnancy, blood tests for diabetes will be done along with other tests if problems develop. Urine tests to check for infections, diabetes, or protein in the urine. An ultrasound to confirm the proper growth and development  of the baby. An amniocentesis to check for possible genetic problems. Fetal screens for spina bifida and Down syndrome. You may need other tests to make sure you and the baby are doing well. HOME CARE INSTRUCTIONS  Medicines Follow your health care provider's instructions regarding medicine use. Specific medicines may be either safe or unsafe to take during pregnancy. Take your prenatal vitamins as directed. If you develop constipation, try taking a stool softener if your health care provider approves. Diet Eat regular, well-balanced meals. Choose a variety of foods, such as meat or vegetable-based protein, fish, milk and low-fat dairy products, vegetables, fruits, and whole grain breads and cereals. Your health care provider will help you determine the amount of weight gain that is right for you. Avoid raw meat and uncooked cheese. These carry germs that can cause birth defects in the baby. Eating four or five small meals rather than three large meals a day may help relieve nausea and vomiting. If you start to feel nauseous, eating a few soda crackers can be helpful. Drinking liquids between meals instead of during meals also seems to help nausea and vomiting. If you develop constipation, eat more high-fiber foods, such as fresh vegetables or fruit and whole grains. Drink enough fluids to keep your urine clear or pale yellow. Activity and Exercise Exercise only as directed by your health care provider. Exercising will help you: Control your weight. Stay in shape. Be prepared for labor and delivery. Experiencing pain or cramping in the lower abdomen or low back is a good sign that you should stop exercising. Check with your health care provider before continuing normal exercises. Try to avoid standing for long periods of time. Move your legs often if you must stand in one place for a long time. Avoid heavy lifting. Wear low-heeled shoes, and practice good posture. You may continue to have sex  unless your health care provider directs you otherwise. Relief of Pain or Discomfort Wear a good support bra for breast tenderness.   Take warm sitz baths to soothe any pain or discomfort caused by hemorrhoids. Use hemorrhoid cream if your health care provider approves.   Rest with your legs elevated if you have leg cramps or low back pain. If you develop varicose veins in your legs, wear support hose. Elevate your feet for 15 minutes, 3-4 times a day. Limit salt in your diet. Prenatal Care Schedule your prenatal visits by the  twelfth week of pregnancy. They are usually scheduled monthly at first, then more often in the last 2 months before delivery. Write down your questions. Take them to your prenatal visits. Keep all your prenatal visits as directed by your health care provider. Safety Wear your seat belt at all times when driving. Make a list of emergency phone numbers, including numbers for family, friends, the hospital, and police and fire departments. General Tips Ask your health care provider for a referral to a local prenatal education class. Begin classes no later than at the beginning of month 6 of your pregnancy. Ask for help if you have counseling or nutritional needs during pregnancy. Your health care provider can offer advice or refer you to specialists for help with various needs. Do not use hot tubs, steam rooms, or saunas. Do not douche or use tampons or scented sanitary pads. Do not cross your legs for long periods of time. Avoid cat litter boxes and soil used by cats. These carry germs that can cause birth defects in the baby and possibly loss of the fetus by miscarriage or stillbirth. Avoid all smoking, herbs, alcohol, and medicines not prescribed by your health care provider. Chemicals in these affect the formation and growth of the baby. Schedule a dentist appointment. At home, brush your teeth with a soft toothbrush and be gentle when you floss. SEEK MEDICAL CARE IF:   You have dizziness. You have mild pelvic cramps, pelvic pressure, or nagging pain in the abdominal area. You have persistent nausea, vomiting, or diarrhea. You have a bad smelling vaginal discharge. You have pain with urination. You notice increased swelling in your face, hands, legs, or ankles. SEEK IMMEDIATE MEDICAL CARE IF:  You have a fever. You are leaking fluid from your vagina. You have spotting or bleeding from your vagina. You have severe abdominal cramping or pain. You have rapid weight gain or loss. You vomit blood or material that looks like coffee grounds. You are exposed to [place] measles and have never had them. You are exposed to fifth disease or chickenpox. You develop a severe headache. You have shortness of breath. You have any kind of trauma, such as from a fall or a car accident. Document Released: 10/16/2001 Document Revised: 03/08/2014 Document Reviewed: 09/01/2013 [facility] Patient Information 2015 [place], [place]. This information is not intended to replace advice given to you by your health care provider. Make sure you discuss any questions you have with your health care provider.

## 2024-11-10 NOTE — Progress Notes (Signed)
 "   INITIAL OBSTETRICAL VISIT Patient name: Carol Buck MRN [id number]  Date of birth: 12-28-2000 Chief Complaint:   Initial Prenatal Visit  History of Present Illness:   Carol Buck is a 24 y.o. G48P1021 Caucasian female at [redacted]w[redacted]d by US  at 6 weeks with an Estimated Date of Delivery: 05/21/25 being seen today for her initial obstetrical visit.   Patient's last menstrual period was 08/01/2024. Her obstetrical history is significant for SAB x 2, term C/S for breech.   Today she reports no complaints.  Last pap 12/12/23. Results were: NILM w/ HRHPV not done     11/10/2024   11:15 AM 12/12/2023    3:40 PM 01/02/2019   10:42 AM  Depression screen PHQ 2/9  Decreased Interest 0 0 0  Down, Depressed, Hopeless 0 0 0  PHQ - 2 Score 0 0 0  Altered sleeping 0 1   Tired, decreased energy 2 1   Change in appetite 1 0   Feeling bad or failure about yourself  0 0   Trouble concentrating 0 0   Moving slowly or fidgety/restless 0 0   Suicidal thoughts 0 0   PHQ-9 Score 3 2       Data saved with a previous flowsheet row definition        11/10/2024   11:15 AM 12/12/2023    3:41 PM  GAD 7 : Generalized Anxiety Score  Nervous, Anxious, on Edge 0 0  Control/stop worrying 0 0  Worry too much - different things 0 0  Trouble relaxing 0 0  Restless 0 0  Easily annoyed or irritable 1 0  Afraid - awful might happen 0 0  Total GAD 7 Score 1 0     Review of Systems:   Pertinent items are noted in HPI Denies cramping/contractions, leakage of fluid, vaginal bleeding, abnormal vaginal discharge w/ itching/odor/irritation, headaches, visual changes, shortness of breath, chest pain, abdominal pain, severe nausea/vomiting, or problems with urination or bowel movements unless otherwise stated above.  Pertinent History Reviewed:  Reviewed past medical,surgical, social, obstetrical and family history.  Reviewed problem list, medications and allergies. OB History  Gravida Para Term Preterm AB Living  4 1 1  0  2 1  SAB IAB Ectopic Multiple Live Births  2 0 0  1    # Outcome Date GA Lbr Len/2nd Weight Sex Type Anes PTL Lv  4 Current           3 Term 12/31/23 [redacted]w[redacted]d  6 lb 12.3 oz (3.07 kg) F CS-LTranv Spinal  LIV     Complications: Breech presentation  2 SAB 11/08/22 [redacted]w[redacted]d       FD  1 SAB 06/2022           Physical Assessment:   Vitals:   11/10/24 1112  BP: 101/69  Pulse: 96  Weight: 127 lb (57.6 kg)  Body mass index is 19.89 kg/m.       Physical Examination:  General appearance - well appearing, and in no distress  Mental status - alert, oriented to person, place, and time  Psych:  She has a normal mood and affect  Skin - warm and dry, normal color, no suspicious lesions noted  Chest - effort normal, all lung fields clear to auscultation bilaterally  Heart - normal rate and regular rhythm  Abdomen - soft, nontender  Extremities:  No swelling or varicosities noted  Thin prep pap is not done   Chaperone: N/A  TODAY'S informal TA u/s: +FCA  and active fetus  No results found for this or any previous visit (from the past 24 hours).  Assessment & Plan:  1) Low-Risk Pregnancy G4P1021 at [redacted]w[redacted]d with an Estimated Date of Delivery: 05/21/25   2) Initial OB visit  3) H/O SAB x 2> currently on prometrium , can stop at 14wks (1/16)  4) Prev c/s for breech> discussed TOLAC, gave consent to review  Meds: No orders of the defined types were placed in this encounter.   Initial labs obtained Continue prenatal vitamins Reviewed n/v relief measures and warning s/s to report Reviewed recommended weight gain based on pre-gravid BMI Encouraged well-balanced diet Genetic & carrier screening discussed: requests Panorama and AFP, Horizon  neg prev preg Ultrasound discussed; fetal survey: requested CCNC completed> form faxed if has or is planning to apply for medicaid The nature of [facility] [employer] with multiple MDs and other Advanced Practice Providers was explained to  patient; also emphasized that fellows, residents, and students are part of our team. Does have home bp cuff. Office bp cuff given: no. Rx sent: n/a. Check bp weekly, let us  know if consistently >140/90.   Follow-up: Return in about 4 weeks (around 12/08/2024) for LROB, AFP, CNM, in person; then @ 20w for anatomy u/s and LROB.   Orders Placed This Encounter  Procedures   Urine Culture   PANORAMA PRENATAL TEST   CBC/D/Plt+RPR+Rh+ABO+RubIgG...   Hemoglobin A1c    Suzen JONELLE Fetters CNM, [facility] 11/10/2024 11:41 AM  "

## 2024-11-11 LAB — CBC/D/PLT+RPR+RH+ABO+RUBIGG...
Antibody Screen: NEGATIVE
Basophils Absolute: 0.1 x10E3/uL (ref 0.0–0.2)
Basos: 1 %
EOS (ABSOLUTE): 0.3 x10E3/uL (ref 0.0–0.4)
Eos: 3 %
HCV Ab: NONREACTIVE
HIV Screen 4th Generation wRfx: NONREACTIVE
Hematocrit: 39.2 % (ref 34.0–46.6)
Hemoglobin: 13.1 g/dL (ref 11.1–15.9)
Hepatitis B Surface Ag: NEGATIVE
Immature Grans (Abs): 0 x10E3/uL (ref 0.0–0.1)
Immature Granulocytes: 0 %
Lymphocytes Absolute: 2.4 x10E3/uL (ref 0.7–3.1)
Lymphs: 23 %
MCH: 30.2 pg (ref 26.6–33.0)
MCHC: 33.4 g/dL (ref 31.5–35.7)
MCV: 90 fL (ref 79–97)
Monocytes Absolute: 0.6 x10E3/uL (ref 0.1–0.9)
Monocytes: 6 %
Neutrophils Absolute: 7.1 x10E3/uL — ABNORMAL HIGH (ref 1.4–7.0)
Neutrophils: 67 %
Platelets: 288 x10E3/uL (ref 150–450)
RBC: 4.34 x10E6/uL (ref 3.77–5.28)
RDW: 13.3 % (ref 11.7–15.4)
RPR Ser Ql: NONREACTIVE
Rh Factor: POSITIVE
Rubella Antibodies, IGG: 3.47 {index}
WBC: 10.5 x10E3/uL (ref 3.4–10.8)

## 2024-11-11 LAB — CERVICOVAGINAL ANCILLARY ONLY
Chlamydia: NEGATIVE
Comment: NEGATIVE
Comment: NORMAL
Neisseria Gonorrhea: NEGATIVE

## 2024-11-11 LAB — HEMOGLOBIN A1C
Est. average glucose Bld gHb Est-mCnc: 105 mg/dL
Hgb A1c MFr Bld: 5.3 % (ref 4.8–5.6)

## 2024-11-11 LAB — HCV INTERPRETATION

## 2024-11-12 LAB — URINE CULTURE

## 2024-11-16 ENCOUNTER — Ambulatory Visit: Payer: Self-pay | Admitting: Women's Health

## 2024-11-16 LAB — PANORAMA PRENATAL TEST FULL PANEL:PANORAMA TEST PLUS 5 ADDITIONAL MICRODELETIONS: FETAL FRACTION: 10.9

## 2024-12-08 ENCOUNTER — Encounter: Payer: Self-pay | Admitting: Women's Health

## 2024-12-08 ENCOUNTER — Ambulatory Visit: Admitting: Women's Health

## 2024-12-08 VITALS — BP 107/71 | HR 111 | Wt 131.0 lb

## 2024-12-08 DIAGNOSIS — Z3482 Encounter for supervision of other normal pregnancy, second trimester: Secondary | ICD-10-CM

## 2024-12-08 DIAGNOSIS — Z3A16 16 weeks gestation of pregnancy: Secondary | ICD-10-CM | POA: Diagnosis not present

## 2024-12-08 DIAGNOSIS — Z1379 Encounter for other screening for genetic and chromosomal anomalies: Secondary | ICD-10-CM

## 2024-12-08 DIAGNOSIS — O34219 Maternal care for unspecified type scar from previous cesarean delivery: Secondary | ICD-10-CM | POA: Diagnosis not present

## 2024-12-08 DIAGNOSIS — Z3480 Encounter for supervision of other normal pregnancy, unspecified trimester: Secondary | ICD-10-CM

## 2024-12-08 DIAGNOSIS — Z363 Encounter for antenatal screening for malformations: Secondary | ICD-10-CM

## 2024-12-08 NOTE — Patient Instructions (Signed)
 Carol Buck, thank you for choosing our office today! We appreciate the opportunity to meet your healthcare needs. You may receive a short survey by mail, e-mail, or through [employer]. If you are happy with your care we would appreciate if you could take just a few minutes to complete the survey questions. We read all of your comments and take your feedback very seriously. Thank you again for choosing our office.  Center for [employer] Team at [facility] ([address] [place], [place] [address]) Entrance C, located off of [address] Free 24/7 valet parking  Go to [email or web address] to register for FREE online childbirth classes  Call the office [phone]) or go to [facility] if: You begin to severe cramping Your water breaks.  Sometimes it is a big gush of fluid, sometimes it is just a trickle that keeps getting your panties wet or running down your legs You have vaginal bleeding.  It is normal to have a small amount of spotting if your cervix was checked.   [facility] Pediatricians/Family Doctors [facility]): [address] Colette Ribas, [phone]           [facility]: [address], [phone]                [facility]): [address], [phone] (call to ask if accepting patients) [facility] Department: [address], [place], [phone]    [facility] Pediatricians/Family Doctors [facility]): [phone] [address], [phone] [facility]: [address] [place], [phone] [facility]: [address], [phone]  [facility] Doctors  [facility]): [phone] [facility]: [address] [phone]   [facility] Doctors [facility]: [address] [phone]  [facility] Doctors  [facility]: [phone], [phone]  Home Blood Pressure Monitoring for Patients   Your provider has recommended that you check your blood pressure (BP) at least once a week at home. If you do not have a blood pressure cuff at home, one will be provided for you. Contact your provider if you have not received your monitor within 1 week.   Helpful Tips for Accurate Home Blood Pressure Checks  Don't smoke, exercise, or drink caffeine 30 minutes before checking your BP Use the restroom before checking your BP (a full bladder can raise your pressure) Relax in a comfortable upright chair Feet on the ground Left arm resting comfortably on a flat surface at the level of your heart Legs uncrossed Back supported Sit quietly and don't talk Place the cuff on your bare arm Adjust snuggly, so that only two fingertips can fit between your skin and the top of the cuff Check 2 readings separated by at least one minute Keep a log of your BP readings For a visual, please reference this diagram: http://ccnc.care/bpdiagram  Provider Name: [facility]     Phone: [phone]  Zone 1: ALL CLEAR  Continue to monitor your symptoms:  BP reading is less than 140 (top number) or less than 90 (bottom number)  No right upper stomach pain No headaches or seeing spots No feeling nauseated or throwing up No swelling in face and hands  Zone 2: CAUTION Call your doctor's office for any of the following:  BP reading is greater than 140 (top number) or greater than  90 (bottom number)  Stomach pain under your ribs in the middle or right side Headaches or seeing spots Feeling nauseated or throwing up Swelling in face and hands  Zone 3: EMERGENCY  Seek immediate medical care if you have any of the following:  BP reading is greater than160 (top number) or greater than 110 (bottom number) Severe headaches not improving with Tylenol Serious difficulty catching your breath Any worsening symptoms from  Zone 2     Second Trimester of Pregnancy The second trimester is from week 14 through week 27 (months 4 through 6). The second trimester is often a time when you feel your best. Your body has adjusted to being pregnant, and you begin to feel better physically. Usually, morning sickness has lessened or quit completely, you may have more energy, and you may have an increase in appetite. The second trimester is also a time when the fetus is growing rapidly. At the end of the sixth month, the fetus is about 9 inches long and weighs about 1 pounds. You will likely begin to feel the baby move (quickening) between 16 and 20 weeks of pregnancy. Body changes during your second trimester Your body continues to go through many changes during your second trimester. The changes vary from woman to woman. Your weight will continue to increase. You will notice your lower abdomen bulging out. You may begin to get stretch marks on your hips, abdomen, and breasts. You may develop headaches that can be relieved by medicines. The medicines should be approved by your health care provider. You may urinate more often because the fetus is pressing on your bladder. You may develop or continue to have heartburn as a result of your pregnancy. You may develop constipation because certain hormones are causing the muscles that push waste through your intestines to slow down. You may develop hemorrhoids or swollen, bulging veins (varicose veins). You may have back pain. This is caused by: Weight gain. Pregnancy hormones that are relaxing the joints in your pelvis. A shift in weight and the muscles that support your balance. Your breasts will continue to grow and they will continue to become tender. Your gums may bleed and may be sensitive to brushing and flossing. Dark spots or blotches (chloasma, mask of pregnancy) may develop on your face. This will likely fade after the baby is born. A dark line from your belly button to  the pubic area (linea nigra) may appear. This will likely fade after the baby is born. You may have changes in your hair. These can include thickening of your hair, rapid growth, and changes in texture. Some women also have hair loss during or after pregnancy, or hair that feels dry or thin. Your hair will most likely return to normal after your baby is born.  What to expect at prenatal visits During a routine prenatal visit: You will be weighed to make sure you and the fetus are growing normally. Your blood pressure will be taken. Your abdomen will be measured to track your baby's growth. The fetal heartbeat will be listened to. Any test results from the previous visit will be discussed.  Your health care provider may ask you: How you are feeling. If you are feeling the baby move. If you have had any abnormal symptoms, such as leaking fluid, bleeding, severe headaches, or abdominal cramping. If you are using any tobacco products, including cigarettes, chewing tobacco, and electronic cigarettes. If you have any questions.  Other tests that may be performed during  your second trimester include: Blood tests that check for: Low iron levels (anemia). High blood sugar that affects pregnant women (gestational diabetes) between 12 and 28 weeks. Rh antibodies. This is to check for a protein on red blood cells (Rh factor). Urine tests to check for infections, diabetes, or protein in the urine. An ultrasound to confirm the proper growth and development of the baby. An amniocentesis to check for possible genetic problems. Fetal screens for spina bifida and Down syndrome. HIV (human immunodeficiency virus) testing. Routine prenatal testing includes screening for HIV, unless you choose not to have this test.  Follow these instructions at home: Medicines Follow your health care provider's instructions regarding medicine use. Specific medicines may be either safe or unsafe to take during  pregnancy. Take a prenatal vitamin that contains at least 600 micrograms (mcg) of folic acid. If you develop constipation, try taking a stool softener if your health care provider approves. Eating and drinking Eat a balanced diet that includes fresh fruits and vegetables, whole grains, good sources of protein such as meat, eggs, or tofu, and low-fat dairy. Your health care provider will help you determine the amount of weight gain that is right for you. Avoid raw meat and uncooked cheese. These carry germs that can cause birth defects in the baby. If you have low calcium intake from food, talk to your health care provider about whether you should take a daily calcium supplement. Limit foods that are high in fat and processed sugars, such as fried and sweet foods. To prevent constipation: Drink enough fluid to keep your urine clear or pale yellow. Eat foods that are high in fiber, such as fresh fruits and vegetables, whole grains, and beans. Activity Exercise only as directed by your health care provider. Most women can continue their usual exercise routine during pregnancy. Try to exercise for 30 minutes at least 5 days a week. Stop exercising if you experience uterine contractions. Avoid heavy lifting, wear low heel shoes, and practice good posture. A sexual relationship may be continued unless your health care provider directs you otherwise. Relieving pain and discomfort Wear a good support bra to prevent discomfort from breast tenderness. Take warm sitz baths to soothe any pain or discomfort caused by hemorrhoids. Use hemorrhoid cream if your health care provider approves. Rest with your legs elevated if you have leg cramps or low back pain. If you develop varicose veins, wear support hose. Elevate your feet for 15 minutes, 3-4 times a day. Limit salt in your diet. Prenatal Care Write down your questions. Take them to your prenatal visits. Keep all your prenatal visits as told by your health  care provider. This is important. Safety Wear your seat belt at all times when driving. Make a list of emergency phone numbers, including numbers for family, friends, the hospital, and police and fire departments. General instructions Ask your health care provider for a referral to a local prenatal education class. Begin classes no later than the beginning of month 6 of your pregnancy. Ask for help if you have counseling or nutritional needs during pregnancy. Your health care provider can offer advice or refer you to specialists for help with various needs. Do not use hot tubs, steam rooms, or saunas. Do not douche or use tampons or scented sanitary pads. Do not cross your legs for long periods of time. Avoid cat litter boxes and soil used by cats. These carry germs that can cause birth defects in the baby and possibly loss of the  fetus by miscarriage or stillbirth. Avoid all smoking, herbs, alcohol, and unprescribed drugs. Chemicals in these products can affect the formation and growth of the baby. Do not use any products that contain nicotine or tobacco, such as cigarettes and e-cigarettes. If you need help quitting, ask your health care provider. Visit your dentist if you have not gone yet during your pregnancy. Use a soft toothbrush to brush your teeth and be gentle when you floss. Contact a health care provider if: You have dizziness. You have mild pelvic cramps, pelvic pressure, or nagging pain in the abdominal area. You have persistent nausea, vomiting, or diarrhea. You have a bad smelling vaginal discharge. You have pain when you urinate. Get help right away if: You have a fever. You are leaking fluid from your vagina. You have spotting or bleeding from your vagina. You have severe abdominal cramping or pain. You have rapid weight gain or weight loss. You have shortness of breath with chest pain. You notice sudden or extreme swelling of your face, hands, ankles, feet, or legs. You  have not felt your baby move in over an hour. You have severe headaches that do not go away when you take medicine. You have vision changes. Summary The second trimester is from week 14 through week 27 (months 4 through 6). It is also a time when the fetus is growing rapidly. Your body goes through many changes during pregnancy. The changes vary from woman to woman. Avoid all smoking, herbs, alcohol, and unprescribed drugs. These chemicals affect the formation and growth your baby. Do not use any tobacco products, such as cigarettes, chewing tobacco, and e-cigarettes. If you need help quitting, ask your health care provider. Contact your health care provider if you have any questions. Keep all prenatal visits as told by your health care provider. This is important. This information is not intended to replace advice given to you by your health care provider. Make sure you discuss any questions you have with your health care provider. Document Released: 10/16/2001 Document Revised: 03/29/2016 Document Reviewed: 12/23/2012 Elsevier Interactive Patient Education  2017 [employer].

## 2024-12-10 ENCOUNTER — Ambulatory Visit: Payer: Self-pay | Admitting: Women's Health

## 2024-12-10 LAB — AFP, SERUM, OPEN SPINA BIFIDA
AFP MoM: 1.28
AFP Value: 45.9 ng/mL
Gest. Age on Collection Date: 16 wk
Maternal Age At EDD: 22.9 a
OSBR Risk 1 IN: 5096
Test Results:: NEGATIVE
Weight: 131 [lb_av]

## 2025-01-06 ENCOUNTER — Other Ambulatory Visit

## 2025-01-06 ENCOUNTER — Encounter: Admitting: Obstetrics and Gynecology
